# Patient Record
Sex: Female | Born: 1980 | Race: White | Hispanic: No | Smoking: Former smoker
Health system: Southern US, Community
[De-identification: ages and names within clinical notes are randomized; demographics above are authoritative.]

## PROBLEM LIST (undated history)

## (undated) DIAGNOSIS — L719 Rosacea, unspecified: Secondary | ICD-10-CM

## (undated) DIAGNOSIS — F32A Depression, unspecified: Secondary | ICD-10-CM

## (undated) DIAGNOSIS — E079 Disorder of thyroid, unspecified: Secondary | ICD-10-CM

## (undated) DIAGNOSIS — E039 Hypothyroidism, unspecified: Secondary | ICD-10-CM

## (undated) DIAGNOSIS — F419 Anxiety disorder, unspecified: Secondary | ICD-10-CM

## (undated) HISTORY — PX: KIDNEY STONE SURGERY: SHX686

---

## 2009-02-23 ENCOUNTER — Ambulatory Visit: Payer: Self-pay | Admitting: Diagnostic Radiology

## 2009-02-23 ENCOUNTER — Emergency Department (HOSPITAL_BASED_OUTPATIENT_CLINIC_OR_DEPARTMENT_OTHER): Admission: EM | Admit: 2009-02-23 | Discharge: 2009-02-24 | Payer: Self-pay | Admitting: Emergency Medicine

## 2012-05-18 ENCOUNTER — Emergency Department
Admission: EM | Admit: 2012-05-18 | Discharge: 2012-05-18 | Disposition: A | Discharge status: Home / Self Care | Payer: BC Managed Care – PPO | Source: Home / Self Care | Attending: Family Medicine | Admitting: Family Medicine

## 2012-05-18 DIAGNOSIS — J069 Acute upper respiratory infection, unspecified: Secondary | ICD-10-CM

## 2012-05-18 MED ORDER — AMOXICILLIN 875 MG PO TABS: 875.0000 mg | ORAL_TABLET | Freq: Two times a day (BID) | ORAL | Status: AC

## 2012-05-18 MED ORDER — BENZONATATE 200 MG PO CAPS: 200.0000 mg | ORAL_CAPSULE | Freq: Every day | ORAL | Status: AC

## 2012-05-18 NOTE — ED Notes (Signed)
Melinda Clark complains of nasal congestion for 3 days and a wet non productive cough for 1 day. She also has hoarse voice. Denies fever, chills or sweats.

## 2012-05-18 NOTE — ED Provider Notes (Signed)
History     CSN: [id number]  Arrival date & time 05/18/12  1741   First MD Initiated Contact with Patient 05/18/12 1829      Chief Complaint  Patient presents with  . Nasal Congestion    x 3 days      HPI Comments: Patient complains of 3 day history of gradually progressive URI symptoms beginning with nasal congestion and a cough.  Complains of fatigue but no myalgias.  Cough is now worse at night and generally productive during the day.  There has been no pleuritic pain, shortness of breath, or wheezes.  No fevers, chills, and sweats.  She notes that she had asthma as a child, and she continues to smoke.  The history is provided by the patient.    History reviewed. No pertinent past medical history.  Past Surgical History  Procedure Date  . Kidney stone surgery     Family History  Problem Relation Age of Onset  . Cancer Other     Breast    History  Substance Use Topics  . Smoking status: Current Everyday Smoker -- 7 years  . Smokeless tobacco: Never Used  . Alcohol Use: Yes     occationally    OB History    Grav Para Term Preterm Abortions TAB SAB Ect Mult Living                  Review of Systems No sore throat + cough No pleuritic pain No wheezing + nasal congestion + post-nasal drainage No sinus pain/pressure No itchy/red eyes No earache No hemoptysis No SOB No fever/chills No nausea No vomiting No abdominal pain No diarrhea No urinary symptoms No skin rashes + fatigue No myalgias No headache Used OTC meds without relief  Allergies  Azithromycin  Home Medications   Current Outpatient Rx  Name Route Sig Dispense Refill  . AMOXICILLIN 875 MG PO TABS Oral Take 1 tablet (875 mg total) by mouth 2 (two) times daily. 20 tablet 0  . BENZONATATE 200 MG PO CAPS Oral Take 1 capsule (200 mg total) by mouth at bedtime. Take as needed for cough 12 capsule 0    BP 107/73  Pulse 82  Temp 97.8 F (36.6 C) (Oral)  Resp 18  Ht 5\' 2"  (1.575 m)  Wt  165 lb (74.844 kg)  BMI 30.18 kg/m2  SpO2 98%  LMP 05/03/2012  Physical Exam Nursing notes and Vital Signs reviewed. Appearance:  Patient appears healthy, stated age, and in no acute distress Eyes:  Pupils are equal, round, and reactive to light and accomodation.  Extraocular movement is intact.  Conjunctivae are not inflamed  Ears:  Canals normal.  Tympanic membranes normal.  Nose:  Mildly congested turbinates.  No sinus tenderness.   Pharynx:  Normal Neck:  Supple.  Slightly tender shotty posterior nodes are palpated bilaterally  Lungs:  Clear to auscultation.  Breath sounds are equal.  Heart:  Regular rate and rhythm without murmurs, rubs, or gallops.  Abdomen:  Nontender without masses or hepatosplenomegaly.  Bowel sounds are present.  No CVA or flank tenderness.  Extremities:  No edema.  No calf tenderness Skin:  No rash present.   ED Course  Procedures none      1. Acute upper respiratory infections of unspecified site       MDM  With a history of smoking, and childhood asthma, will begin amoxicillin.  Prescription written for Benzonatate [facility]) to take at bedtime for night-time cough.  Take  plain Mucinex (guaifenesin) twice daily for cough and congestion.  Increase fluid intake, rest. May use Afrin nasal spray (or generic oxymetazoline) twice daily for about 5 days.  Also recommend using saline nasal spray several times daily and saline nasal irrigation (AYR is a common brand) Stop all antihistamines for now, and other non-prescription cough/cold preparations.. Follow-up with family doctor if not improving 7 to 10 days.         Lattie Haw, MD 05/22/12 1230

## 2012-05-24 ENCOUNTER — Telehealth: Payer: Self-pay

## 2012-05-24 NOTE — ED Notes (Signed)
Left a message on voice mail asking how patient is feeling and advising to call back with any questions or concerns.  

## 2022-02-05 ENCOUNTER — Other Ambulatory Visit: Payer: Self-pay

## 2022-02-05 ENCOUNTER — Encounter (HOSPITAL_BASED_OUTPATIENT_CLINIC_OR_DEPARTMENT_OTHER): Payer: Self-pay

## 2022-02-05 ENCOUNTER — Emergency Department (HOSPITAL_BASED_OUTPATIENT_CLINIC_OR_DEPARTMENT_OTHER): Payer: BC Managed Care – PPO

## 2022-02-05 ENCOUNTER — Emergency Department (HOSPITAL_BASED_OUTPATIENT_CLINIC_OR_DEPARTMENT_OTHER)
Admission: EM | Admit: 2022-02-05 | Discharge: 2022-02-05 | Disposition: A | Discharge status: Home / Self Care | Payer: BC Managed Care – PPO | Attending: Emergency Medicine | Admitting: Emergency Medicine

## 2022-02-05 DIAGNOSIS — M25572 Pain in left ankle and joints of left foot: Secondary | ICD-10-CM | POA: Insufficient documentation

## 2022-02-05 DIAGNOSIS — W108XXA Fall (on) (from) other stairs and steps, initial encounter: Secondary | ICD-10-CM | POA: Insufficient documentation

## 2022-02-05 DIAGNOSIS — S99912A Unspecified injury of left ankle, initial encounter: Secondary | ICD-10-CM | POA: Diagnosis present

## 2022-02-05 DIAGNOSIS — S82302A Unspecified fracture of lower end of left tibia, initial encounter for closed fracture: Secondary | ICD-10-CM | POA: Diagnosis not present

## 2022-02-05 DIAGNOSIS — S82832A Other fracture of upper and lower end of left fibula, initial encounter for closed fracture: Secondary | ICD-10-CM | POA: Insufficient documentation

## 2022-02-05 HISTORY — DX: Depression, unspecified: F32.A

## 2022-02-05 HISTORY — DX: Disorder of thyroid, unspecified: E07.9

## 2022-02-05 HISTORY — DX: Anxiety disorder, unspecified: F41.9

## 2022-02-05 MED ORDER — OXYCODONE-ACETAMINOPHEN 5-325 MG PO TABS
2.0000 | ORAL_TABLET | Freq: Once | ORAL | Status: AC
  Administered 2022-02-05: 2 via ORAL
  Filled 2022-02-05: qty 2

## 2022-02-05 MED ORDER — OXYCODONE-ACETAMINOPHEN 5-325 MG PO TABS: 2.0000 | ORAL_TABLET | Freq: Four times a day (QID) | ORAL | 0 refills | Status: DC | PRN

## 2022-02-05 NOTE — Discharge Instructions (Signed)
Please call the orthopedics to schedule an appointment.  As we discussed, you can take 800 mg of ibuprofen every 8 hours.  You can use Percocet for breakthrough pain.  Please return to the emergency department for any worsening symptoms you might have.  Please keep leg elevated. ?

## 2022-02-05 NOTE — ED Provider Notes (Signed)
?[facility] EMERGENCY DEPARTMENT ?Provider Note ? ? ?CSN: [id number] ?Arrival date & time: 02/05/22  1011 ? ?  ? ?History ?Chief Complaint  ?Patient presents with  ? Ankle Injury  ? ? ?Melinda Clark is a 41 y.o. female who presents to the emergency department with left ankle pain after mechanical slip and fall down 3 steps just prior to arrival.  Patient was walking going down steps when she slipped on a wet surface when she felt her ankle pop and go backwards.  She had immediate pain since the incident and has been unable to bear weight.  No other injury.  Did not hit her head or lose consciousness. ? ? ?Ankle Injury ? ? ?  ? ?Home Medications ?Prior to Admission medications   ?Medication Sig Start Date End Date Taking? Authorizing Provider  ?oxyCODONE-acetaminophen (PERCOCET/ROXICET) 5-325 MG tablet Take 2 tablets by mouth every 6 (six) hours as needed for severe pain. 02/05/22  Yes Teressa Lower, PA-C  ?   ? ?Allergies    ?Azithromycin, Escitalopram oxalate, and Metronidazole   ? ?Review of Systems   ?Review of Systems  ?All other systems reviewed and are negative. ? ?Physical Exam ?Updated Vital Signs ?BP 102/71   Pulse 81   Temp 97.8 ?F (36.6 ?C) (Oral)   Resp 18   Ht 5\' 1"  (1.549 m)   Wt 83.9 kg   SpO2 99%   BMI 34.96 kg/m?  ?Physical Exam ?Vitals and nursing note reviewed.  ?Constitutional:   ?   Appearance: Normal appearance.  ?HENT:  ?   Head: Normocephalic and atraumatic.  ?Eyes:  ?   General:     ?   Right eye: No discharge.     ?   Left eye: No discharge.  ?   Conjunctiva/sclera: Conjunctivae normal.  ?Pulmonary:  ?   Effort: Pulmonary effort is normal.  ?Musculoskeletal:  ?   Comments: There is mild swelling to the left ankle.  No obvious ecchymosis or deformity.  Patient has strong equal 2+ dorsalis pedis pulses bilaterally.  Good sensation in her toes.  Difficult to assess cap refill is toenails are painted.  Able to move her left toes. Squeeze test negative.  ?Skin: ?   General:  Skin is warm and dry.  ?   Findings: No rash.  ?Neurological:  ?   General: No focal deficit present.  ?   Mental Status: She is alert.  ?Psychiatric:     ?   Mood and Affect: Mood normal.     ?   Behavior: Behavior normal.  ? ? ?ED Results / Procedures / Treatments   ?Labs ?(all labs ordered are listed, but only abnormal results are displayed) ?Labs Reviewed - No data to display ? ?EKG ?None ? ?Radiology ?DG Ankle Complete Left ? ?Result Date: 02/05/2022 ?CLINICAL DATA:  Trauma, fall EXAM: LEFT ANKLE COMPLETE - 3+ VIEW COMPARISON:  None. FINDINGS: There is oblique fracture in the distal shaft and distal metaphysis of left fibula. There is a proximally 3 mm offset in the alignment. There is fracture in the base of medial malleolus with a proximally 3 mm offset in the alignment. There is fracture in the posterior aspect of distal tibia involving the articular surface. IMPRESSION: Oblique, slightly displaced fracture is seen in the distal shaft and distal metaphysis of left fibula. Slightly displaced fracture is seen in the medial malleolus in the left tibia. Essentially undisplaced fracture is seen in the posterior aspect of distal left tibia.  Electronically Signed   By: Ernie Avena M.D.   On: 02/05/2022 10:50  ? ?DG Foot Complete Left ? ?Result Date: 02/05/2022 ?CLINICAL DATA:  Trauma, fall EXAM: LEFT FOOT - COMPLETE 3+ VIEW COMPARISON:  None. FINDINGS: Fractures are seen in the distal left tibia and fibula which will better evaluated in the images of left ankle. Rest of the visualized bony structures show no recent fracture or dislocation. There are no opaque foreign bodies. IMPRESSION: Fractures are seen in the distal left tibia and fibula. Electronically Signed   By: Ernie Avena M.D.   On: 02/05/2022 10:48   ? ?Procedures ?Procedures  ? ? ?Medications Ordered in ED ?Medications  ?oxyCODONE-acetaminophen (PERCOCET/ROXICET) 5-325 MG per tablet 2 tablet (2 tablets Oral Given 02/05/22 1027)  ? ? ?ED  Course/ Medical Decision Making/ A&P ?  ?                        ?Medical Decision Making ?Amount and/or Complexity of Data Reviewed ?Radiology: ordered. ? ?Risk ?Prescription drug management. ? ? ?This patient presents to the ED for concern of left ankle pain, this involves an extensive number of treatment options, and is a complaint that carries with it a high risk of complications and morbidity.  The differential diagnosis includes fracture dislocation.  I doubt any syndesmosis injury as squeeze test was negative.  I doubt compartment syndrome.  I doubt arterial compromise. ? ? ?Co morbidities that complicate the patient evaluation ? ?Past Medical History:  ?Diagnosis Date  ? Anxiety   ? Depression   ? Thyroid disease   ? ? ?Additional history obtained: ? ?Additional history obtained from nursing note ? ? ?Lab Tests: ? ?I Ordered, and personally interpreted labs.  The pertinent results include: None ? ? ?Imaging Studies ordered: ? ?I ordered imaging studies including x-ray of the ankle and foot ?I independently visualized and interpreted imaging which showed fibular and tibial fracture ?I agree with the radiologist interpretation ? ? ?Cardiac Monitoring: ? ?The patient was maintained on a cardiac monitor.  I personally viewed and interpreted the cardiac monitored which showed an underlying rhythm of: Normal sinus rhythm ? ? ?Medicines ordered and prescription drug management: ? ?I ordered medication including Percocet for pain ?Reevaluation of the patient after these medicines showed that the patient improved ?I have reviewed the patients home medicines and have made adjustments as needed ? ? ?Critical Interventions: ? ?Pain control ? ? ?Problem List / ED Course: ? ?Tibia and fibular fracture. Mortise is in good alignment and fractures do not appear to be significantly angulated or displaced.  Do not feel that manipulation is necessary at this time.  Patient placed in stirrup and posterior ankle splint and  crutches were given.  I have sent her narcotic pain medication to her pharmacy.  Given her follow-up with orthopedics.  She was encouraged to return to the emergency department for any worsening symptoms. ? ? ?Reevaluation: ? ?After the interventions noted above, I reevaluated the patient and found that they have :improved ? ? ?Social Determinants of Health: ? ?Social Determinants of Health with Concerns  ? ?Tobacco Use: Medium Risk  ? Smoking Tobacco Use: Former  ? Smokeless Tobacco Use: Never  ? Passive Exposure: Not on file  ?Financial Resource Strain: Not on file  ?Food Insecurity: Not on file  ?Transportation Needs: Not on file  ?Physical Activity: Not on file  ?Stress: Not on file  ?Social Connections: Not on file  ?Intimate  Partner Violence: Not on file  ?Depression (PHQ2-9): Not on file  ?Alcohol Screen: Not on file  ?Housing: Not on file  ? ? ?Dispostion: ? ?After consideration of the diagnostic results and the patients response to treatment, I feel that the patent would benefit from outpatient follow-up with orthopedics.  Patient does not meet inpatient criteria at this time ? ?Final Clinical Impression(s) / ED Diagnoses ?Final diagnoses:  ?Closed fracture of distal end of left fibula, unspecified fracture morphology, initial encounter  ?Closed fracture of distal end of left tibia, unspecified fracture morphology, initial encounter  ? ? ?Rx / DC Orders ?ED Discharge Orders   ? ?      Ordered  ?  oxyCODONE-acetaminophen (PERCOCET/ROXICET) 5-325 MG tablet  Every 6 hours PRN       ? 02/05/22 1110  ? ?  ?  ? ?  ? ? ?  ?Honor LohFleming, Camesha Farooq [place]ouch[place], PA-C ?02/05/22 1157 ? ?  ?Maia PlanLong, Joshua G, MD ?02/12/22 0005 ? ?

## 2022-02-05 NOTE — ED Triage Notes (Signed)
[place] C[place]o [place] ankle injury. ?

## 2022-02-07 ENCOUNTER — Other Ambulatory Visit (HOSPITAL_COMMUNITY): Payer: Self-pay | Admitting: Orthopedic Surgery

## 2022-02-07 ENCOUNTER — Encounter (HOSPITAL_BASED_OUTPATIENT_CLINIC_OR_DEPARTMENT_OTHER): Payer: Self-pay | Admitting: Orthopedic Surgery

## 2022-02-07 ENCOUNTER — Other Ambulatory Visit: Payer: Self-pay

## 2022-02-08 NOTE — Progress Notes (Signed)

## 2022-02-10 ENCOUNTER — Ambulatory Visit (HOSPITAL_BASED_OUTPATIENT_CLINIC_OR_DEPARTMENT_OTHER): Payer: BC Managed Care – PPO | Admitting: Certified Registered"

## 2022-02-10 ENCOUNTER — Encounter (HOSPITAL_BASED_OUTPATIENT_CLINIC_OR_DEPARTMENT_OTHER): Payer: Self-pay | Admitting: Orthopedic Surgery

## 2022-02-10 ENCOUNTER — Ambulatory Visit (HOSPITAL_BASED_OUTPATIENT_CLINIC_OR_DEPARTMENT_OTHER)
Admission: RE | Admit: 2022-02-10 | Discharge: 2022-02-10 | Disposition: A | Discharge status: Home / Self Care | Payer: BC Managed Care – PPO | Attending: Orthopedic Surgery | Admitting: Orthopedic Surgery

## 2022-02-10 ENCOUNTER — Other Ambulatory Visit: Payer: Self-pay

## 2022-02-10 ENCOUNTER — Encounter (HOSPITAL_BASED_OUTPATIENT_CLINIC_OR_DEPARTMENT_OTHER)
Admission: RE | Disposition: A | Discharge status: Home / Self Care | Payer: Self-pay | Source: Home / Self Care | Attending: Orthopedic Surgery

## 2022-02-10 ENCOUNTER — Ambulatory Visit (HOSPITAL_BASED_OUTPATIENT_CLINIC_OR_DEPARTMENT_OTHER): Payer: BC Managed Care – PPO

## 2022-02-10 DIAGNOSIS — F419 Anxiety disorder, unspecified: Secondary | ICD-10-CM | POA: Diagnosis not present

## 2022-02-10 DIAGNOSIS — S82852A Displaced trimalleolar fracture of left lower leg, initial encounter for closed fracture: Secondary | ICD-10-CM | POA: Diagnosis present

## 2022-02-10 DIAGNOSIS — Z79899 Other long term (current) drug therapy: Secondary | ICD-10-CM | POA: Insufficient documentation

## 2022-02-10 DIAGNOSIS — Z87891 Personal history of nicotine dependence: Secondary | ICD-10-CM | POA: Diagnosis not present

## 2022-02-10 DIAGNOSIS — E079 Disorder of thyroid, unspecified: Secondary | ICD-10-CM | POA: Insufficient documentation

## 2022-02-10 DIAGNOSIS — W108XXA Fall (on) (from) other stairs and steps, initial encounter: Secondary | ICD-10-CM | POA: Insufficient documentation

## 2022-02-10 DIAGNOSIS — Z79890 Hormone replacement therapy (postmenopausal): Secondary | ICD-10-CM | POA: Insufficient documentation

## 2022-02-10 DIAGNOSIS — F32A Depression, unspecified: Secondary | ICD-10-CM | POA: Insufficient documentation

## 2022-02-10 HISTORY — PX: ORIF ANKLE FRACTURE: SHX5408

## 2022-02-10 LAB — POCT PREGNANCY, URINE: Preg Test, Ur: NEGATIVE

## 2022-02-10 SURGERY — OPEN REDUCTION INTERNAL FIXATION (ORIF) ANKLE FRACTURE
Anesthesia: General | Site: Ankle | Laterality: Left

## 2022-02-10 MED ORDER — OXYCODONE HCL 5 MG/5ML PO SOLN: 5.0000 mg | Freq: Once | ORAL | Status: DC | PRN

## 2022-02-10 MED ORDER — VANCOMYCIN HCL 500 MG IV SOLR
INTRAVENOUS | Status: DC | PRN
  Administered 2022-02-10: 500 mg via TOPICAL

## 2022-02-10 MED ORDER — SUGAMMADEX SODIUM 500 MG/5ML IV SOLN
INTRAVENOUS | Status: AC
  Filled 2022-02-10: qty 5

## 2022-02-10 MED ORDER — ONDANSETRON HCL 4 MG/2ML IJ SOLN
INTRAMUSCULAR | Status: AC
  Filled 2022-02-10: qty 16

## 2022-02-10 MED ORDER — ONDANSETRON HCL 4 MG/2ML IJ SOLN
INTRAMUSCULAR | Status: DC | PRN
  Administered 2022-02-10: 4 mg via INTRAVENOUS

## 2022-02-10 MED ORDER — PROPOFOL 500 MG/50ML IV EMUL
INTRAVENOUS | Status: AC
  Filled 2022-02-10: qty 150

## 2022-02-10 MED ORDER — BUPIVACAINE-EPINEPHRINE (PF) 0.5% -1:200000 IJ SOLN
INTRAMUSCULAR | Status: DC | PRN
  Administered 2022-02-10: 25 mL via PERINEURAL
  Administered 2022-02-10: 15 mL via PERINEURAL

## 2022-02-10 MED ORDER — DEXAMETHASONE SODIUM PHOSPHATE 10 MG/ML IJ SOLN
INTRAMUSCULAR | Status: DC | PRN
  Administered 2022-02-10: 4 mg via INTRAVENOUS

## 2022-02-10 MED ORDER — DOCUSATE SODIUM 100 MG PO CAPS: 100.0000 mg | ORAL_CAPSULE | Freq: Two times a day (BID) | ORAL | 0 refills | Status: DC

## 2022-02-10 MED ORDER — FENTANYL CITRATE (PF) 100 MCG/2ML IJ SOLN
INTRAMUSCULAR | Status: DC | PRN
  Administered 2022-02-10: 25 ug via INTRAVENOUS

## 2022-02-10 MED ORDER — FENTANYL CITRATE (PF) 100 MCG/2ML IJ SOLN
25.0000 ug | INTRAMUSCULAR | Status: DC | PRN
  Administered 2022-02-10: 50 ug via INTRAVENOUS

## 2022-02-10 MED ORDER — OXYCODONE HCL 5 MG PO TABS: 5.0000 mg | ORAL_TABLET | ORAL | 0 refills | Status: AC | PRN

## 2022-02-10 MED ORDER — MIDAZOLAM HCL 2 MG/2ML IJ SOLN
2.0000 mg | Freq: Once | INTRAMUSCULAR | Status: AC
  Administered 2022-02-10: 2 mg via INTRAVENOUS

## 2022-02-10 MED ORDER — CEFAZOLIN SODIUM-DEXTROSE 2-4 GM/100ML-% IV SOLN
INTRAVENOUS | Status: AC
  Filled 2022-02-10: qty 100

## 2022-02-10 MED ORDER — OXYCODONE HCL 5 MG PO TABS: 5.0000 mg | ORAL_TABLET | Freq: Once | ORAL | Status: DC | PRN

## 2022-02-10 MED ORDER — LACTATED RINGERS IV SOLN: INTRAVENOUS | Status: DC

## 2022-02-10 MED ORDER — ASPIRIN EC 81 MG PO TBEC
81.0000 mg | DELAYED_RELEASE_TABLET | Freq: Two times a day (BID) | ORAL | 0 refills | Status: DC
Start: 2022-02-10 — End: 2022-10-13

## 2022-02-10 MED ORDER — ROCURONIUM BROMIDE 10 MG/ML (PF) SYRINGE
PREFILLED_SYRINGE | INTRAVENOUS | Status: AC
  Filled 2022-02-10: qty 20

## 2022-02-10 MED ORDER — 0.9 % SODIUM CHLORIDE (POUR BTL) OPTIME
TOPICAL | Status: DC | PRN
  Administered 2022-02-10: 200 mL

## 2022-02-10 MED ORDER — DEXAMETHASONE SODIUM PHOSPHATE 10 MG/ML IJ SOLN
INTRAMUSCULAR | Status: AC
  Filled 2022-02-10: qty 2

## 2022-02-10 MED ORDER — FENTANYL CITRATE (PF) 100 MCG/2ML IJ SOLN
INTRAMUSCULAR | Status: AC
  Filled 2022-02-10: qty 2

## 2022-02-10 MED ORDER — FENTANYL CITRATE (PF) 100 MCG/2ML IJ SOLN
100.0000 ug | Freq: Once | INTRAMUSCULAR | Status: AC
  Administered 2022-02-10: 100 ug via INTRAVENOUS

## 2022-02-10 MED ORDER — SENNA 8.6 MG PO TABS
2.0000 | ORAL_TABLET | Freq: Two times a day (BID) | ORAL | 0 refills | Status: DC
Start: 2022-02-10 — End: 2022-10-13

## 2022-02-10 MED ORDER — VANCOMYCIN HCL 500 MG IV SOLR
INTRAVENOUS | Status: AC
  Filled 2022-02-10: qty 10

## 2022-02-10 MED ORDER — SODIUM CHLORIDE 0.9 % IV SOLN: INTRAVENOUS | Status: DC

## 2022-02-10 MED ORDER — MIDAZOLAM HCL 2 MG/2ML IJ SOLN
INTRAMUSCULAR | Status: AC
Start: 2022-02-10 — End: ?
  Filled 2022-02-10: qty 2

## 2022-02-10 MED ORDER — CEFAZOLIN SODIUM-DEXTROSE 2-4 GM/100ML-% IV SOLN: 2.0000 g | INTRAVENOUS | Status: DC

## 2022-02-10 MED ORDER — PROPOFOL 500 MG/50ML IV EMUL
INTRAVENOUS | Status: DC | PRN
  Administered 2022-02-10: 25 ug/kg/min via INTRAVENOUS

## 2022-02-10 MED ORDER — MIDAZOLAM HCL 5 MG/5ML IJ SOLN
INTRAMUSCULAR | Status: DC | PRN
Start: 2022-02-10 — End: 2022-02-10
  Administered 2022-02-10: 2 mg via INTRAVENOUS

## 2022-02-10 MED ORDER — LIDOCAINE 2% (20 MG/ML) 5 ML SYRINGE
INTRAMUSCULAR | Status: AC
  Filled 2022-02-10: qty 20

## 2022-02-10 MED ORDER — PROPOFOL 10 MG/ML IV BOLUS
INTRAVENOUS | Status: DC | PRN
  Administered 2022-02-10: 200 mg via INTRAVENOUS

## 2022-02-10 MED ORDER — MIDAZOLAM HCL 2 MG/2ML IJ SOLN
INTRAMUSCULAR | Status: AC
  Filled 2022-02-10: qty 2

## 2022-02-10 MED ORDER — LIDOCAINE 2% (20 MG/ML) 5 ML SYRINGE
INTRAMUSCULAR | Status: DC | PRN
Start: 2022-02-10 — End: 2022-02-10
  Administered 2022-02-10: 60 mg via INTRAVENOUS

## 2022-02-10 MED ORDER — ONDANSETRON HCL 4 MG/2ML IJ SOLN: 4.0000 mg | Freq: Four times a day (QID) | INTRAMUSCULAR | Status: DC | PRN

## 2022-02-10 SURGICAL SUPPLY — 75 items
APL PRP STRL LF DISP 70% ISPRP (MISCELLANEOUS) ×1
BANDAGE ESMARK 6X9 LF (GAUZE/BANDAGES/DRESSINGS) IMPLANT
BIT DRILL 2.5X2.75 QC CALB (BIT) ×1 IMPLANT
BIT DRILL 3.5X5.5 QC CALB (BIT) ×1 IMPLANT
BLADE SURG 15 STRL LF DISP TIS (BLADE) ×2 IMPLANT
BLADE SURG 15 STRL SS (BLADE) ×4
BNDG CMPR 9X4 STRL LF SNTH (GAUZE/BANDAGES/DRESSINGS)
BNDG CMPR 9X6 STRL LF SNTH (GAUZE/BANDAGES/DRESSINGS)
BNDG COHESIVE 4X5 TAN ST LF (GAUZE/BANDAGES/DRESSINGS) ×1 IMPLANT
BNDG COHESIVE 6X5 TAN ST LF (GAUZE/BANDAGES/DRESSINGS) ×1 IMPLANT
BNDG ELASTIC 4X5.8 VLCR STR LF (GAUZE/BANDAGES/DRESSINGS) ×1 IMPLANT
BNDG ELASTIC 6X5.8 VLCR STR LF (GAUZE/BANDAGES/DRESSINGS) ×1 IMPLANT
BNDG ESMARK 4X9 LF (GAUZE/BANDAGES/DRESSINGS) IMPLANT
BNDG ESMARK 6X9 LF (GAUZE/BANDAGES/DRESSINGS)
CANISTER SUCT 1200ML W/VALVE (MISCELLANEOUS) ×2 IMPLANT
CHLORAPREP W/TINT 26 (MISCELLANEOUS) ×2 IMPLANT
COVER BACK TABLE 60X90IN (DRAPES) ×2 IMPLANT
CUFF TOURN SGL QUICK 34 (TOURNIQUET CUFF)
CUFF TRNQT CYL 34X4.125X (TOURNIQUET CUFF) IMPLANT
DRAPE EXTREMITY T 121X128X90 (DISPOSABLE) ×2 IMPLANT
DRAPE OEC MINIVIEW 54X84 (DRAPES) ×2 IMPLANT
DRAPE U-SHAPE 47X51 STRL (DRAPES) ×2 IMPLANT
DRSG MEPITEL 4X7.2 (GAUZE/BANDAGES/DRESSINGS) ×2 IMPLANT
DRSG PAD ABDOMINAL 8X10 ST (GAUZE/BANDAGES/DRESSINGS) ×4 IMPLANT
ELECT REM PT RETURN 9FT ADLT (ELECTROSURGICAL) ×2
ELECTRODE REM PT RTRN 9FT ADLT (ELECTROSURGICAL) ×1 IMPLANT
GAUZE SPONGE 4X4 12PLY STRL (GAUZE/BANDAGES/DRESSINGS) ×2 IMPLANT
GLOVE SRG 8 PF TXTR STRL LF DI (GLOVE) ×2 IMPLANT
GLOVE SURG ENC MOIS LTX SZ8 (GLOVE) ×2 IMPLANT
GLOVE SURG LTX SZ8 (GLOVE) ×2 IMPLANT
GLOVE SURG POLYISO LF SZ7 (GLOVE) ×1 IMPLANT
GLOVE SURG UNDER POLY LF SZ7 (GLOVE) ×2 IMPLANT
GLOVE SURG UNDER POLY LF SZ8 (GLOVE) ×4
GOWN STRL REUS W/ TWL LRG LVL3 (GOWN DISPOSABLE) ×1 IMPLANT
GOWN STRL REUS W/ TWL XL LVL3 (GOWN DISPOSABLE) ×2 IMPLANT
GOWN STRL REUS W/TWL LRG LVL3 (GOWN DISPOSABLE) ×2
GOWN STRL REUS W/TWL XL LVL3 (GOWN DISPOSABLE) ×4
NEEDLE HYPO 22GX1.5 SAFETY (NEEDLE) IMPLANT
NS IRRIG 1000ML POUR BTL (IV SOLUTION) ×2 IMPLANT
PACK BASIN DAY SURGERY FS (CUSTOM PROCEDURE TRAY) ×2 IMPLANT
PAD CAST 4YDX4 CTTN HI CHSV (CAST SUPPLIES) ×1 IMPLANT
PADDING CAST ABS 4INX4YD NS (CAST SUPPLIES)
PADDING CAST ABS COTTON 4X4 ST (CAST SUPPLIES) IMPLANT
PADDING CAST COTTON 4X4 STRL (CAST SUPPLIES) ×2
PADDING CAST COTTON 6X4 STRL (CAST SUPPLIES) ×2 IMPLANT
PENCIL SMOKE EVACUATOR (MISCELLANEOUS) ×2 IMPLANT
PLATE ACE 100DEG 6HOLE (Plate) ×1 IMPLANT
SANITIZER HAND PURELL 535ML FO (MISCELLANEOUS) ×2 IMPLANT
SCREW ACE CAN 4.0 40M (Screw) ×2 IMPLANT
SCREW CORTICAL 3.5MM  16MM (Screw) ×3 IMPLANT
SCREW CORTICAL 3.5MM 14MM (Screw) ×2 IMPLANT
SCREW CORTICAL 3.5MM 16MM (Screw) IMPLANT
SCREW CORTICAL 3.5MM 26MM (Screw) ×1 IMPLANT
SHEET MEDIUM DRAPE 40X70 STRL (DRAPES) ×2 IMPLANT
SLEEVE SCD COMPRESS KNEE MED (STOCKING) ×2 IMPLANT
SPIKE FLUID TRANSFER (MISCELLANEOUS) IMPLANT
SPLINT FAST PLASTER 5X30 (CAST SUPPLIES) ×20
SPLINT PLASTER CAST FAST 5X30 (CAST SUPPLIES) ×20 IMPLANT
SPONGE T-LAP 18X18 ~~LOC~~+RFID (SPONGE) ×2 IMPLANT
STOCKINETTE 6  STRL (DRAPES) ×1
STOCKINETTE 6 STRL (DRAPES) ×1 IMPLANT
SUCTION FRAZIER HANDLE 10FR (MISCELLANEOUS) ×1
SUCTION TUBE FRAZIER 10FR DISP (MISCELLANEOUS) ×1 IMPLANT
SUT ETHILON 3 0 PS 1 (SUTURE) ×3 IMPLANT
SUT FIBERWIRE #2 38 T-5 BLUE (SUTURE)
SUT MNCRL AB 3-0 PS2 18 (SUTURE) IMPLANT
SUT VIC AB 2-0 SH 27 (SUTURE) ×4
SUT VIC AB 2-0 SH 27XBRD (SUTURE) ×1 IMPLANT
SUT VICRYL 0 SH 27 (SUTURE) IMPLANT
SUTURE FIBERWR #2 38 T-5 BLUE (SUTURE) IMPLANT
SYR BULB EAR ULCER 3OZ GRN STR (SYRINGE) ×2 IMPLANT
SYR CONTROL 10ML LL (SYRINGE) IMPLANT
TOWEL GREEN STERILE FF (TOWEL DISPOSABLE) ×4 IMPLANT
TUBE CONNECTING 20X1/4 (TUBING) ×2 IMPLANT
UNDERPAD 30X36 HEAVY ABSORB (UNDERPADS AND DIAPERS) ×2 IMPLANT

## 2022-02-10 NOTE — Transfer of Care (Signed)
Immediate Anesthesia Transfer of Care Note ? ?Patient: Melinda Clark ? ?Procedure(s) Performed: Open Reduction Internal Fixation (ORIF) Left ankle trimalleolar fracture (Left: Ankle) ? ?Patient Location: PACU ? ?Anesthesia Type:GA combined with regional for post-op pain ? ?Level of Consciousness: drowsy and patient cooperative ? ?Airway & Oxygen Therapy: Patient Spontanous Breathing and Patient connected to face mask oxygen ? ?Post-op Assessment: Report given to RN and Post -op Vital signs reviewed and stable ? ?Post vital signs: Reviewed and stable ? ?Last Vitals:  ?Vitals Value Taken Time  ?BP    ?Temp    ?Pulse 73 02/10/22 1022  ?Resp 25 02/10/22 1022  ?SpO2 98 % 02/10/22 1022  ?Vitals shown include unvalidated device data. ? ?Last Pain:  ?Vitals:  ? 02/10/22 0815  ?TempSrc: Oral  ?PainSc: 4   ?   ? ?Patients Stated Pain Goal: 5 (02/10/22 0815) ? ?Complications: No notable events documented. ?

## 2022-02-10 NOTE — Progress Notes (Signed)
Assisted Dr. Marcie Bal with left, adductor canal, popliteal, ultrasound guided block. Side rails up, monitors on throughout procedure. See vital signs in flow sheet. Tolerated Procedure well. ?

## 2022-02-10 NOTE — Anesthesia Procedure Notes (Signed)
Anesthesia Regional Block: Popliteal block  ? ?Pre-Anesthetic Checklist: , timeout performed,  Correct Patient, Correct Site, Correct Laterality,  Correct Procedure, Correct Position, site marked,  Risks and benefits discussed,  Surgical consent,  Pre-op evaluation,  At surgeon's request and post-op pain management ? ?Laterality: Left ? ?Prep: chloraprep     ?  ?Needles:  ?Injection technique: Single-shot ? ?Needle Type: Echogenic Stimulator Needle   ? ? ? ? ? ? ? ?Additional Needles: ? ? ?Procedures:, nerve stimulator,,,,,    ? ?Nerve Stimulator or Paresthesia:  ?Response: plantar flexion of foot, 0.45 mA ? ?Additional Responses:  ? ?Narrative:  ?Start time: 02/10/2022 8:55 AM ?End time: 02/10/2022 9:03 AM ?Injection made incrementally with aspirations every 5 mL. ? ?Performed by: Personally  ?Anesthesiologist: Achille Rich, MD ? ?Additional Notes: ?Functioning IV was confirmed and monitors were applied.  A 43mm 21ga Arrow echogenic stimulator needle was used. Sterile prep and drape,hand hygiene and sterile gloves were used.  Negative aspiration and negative test dose prior to incremental administration of local anesthetic. The patient tolerated the procedure well. ? Ultrasound guidance: relevent anatomy identified, needle position confirmed, local anesthetic spread visualized around nerve(s), vascular puncture avoided.  Image printed for medical record.  ? ? ? ? ?

## 2022-02-10 NOTE — Discharge Instructions (Addendum)
Toni Arthurs, MD ?[facility] ? ?Please read the following information regarding your care after surgery. ? ?Medications  ?You only need a prescription for the narcotic pain medicine (ex. oxycodone, Percocet, Norco).  All of the other medicines listed below are available over the counter. ?? Aleve 2 pills twice a day for the first 3 days after surgery. ?? acetominophen (Tylenol) 650 mg every 4-6 hours as you need for minor to moderate pain ?? oxycodone as prescribed for severe pain ? ?Narcotic pain medicine (ex. oxycodone, Percocet, Vicodin) will cause constipation.  To prevent this problem, take the following medicines while you are taking any pain medicine. ?? docusate sodium (Colace) 100 mg twice a day ? senna (Senokot) 2 tablets twice a day ? ?? To help prevent blood clots, take a baby aspirin (81 mg) twice a day for two weeks after surgery.  You should also get up every hour while you are awake to move around.   ? ?Weight Bearing ?? Do not bear any weight on the operated leg or foot. ? ?Cast / Splint / Dressing ?? Keep your splint, cast or dressing clean and dry.  Don?t put anything (coat hanger, pencil, etc) down inside of it.  If it gets damp, use a hair dryer on the cool setting to dry it.  If it gets soaked, call the office to schedule an appointment for a cast change. ? ? ?After your dressing, cast or splint is removed; you may shower, but do not soak or scrub the wound.  Allow the water to run over it, and then gently pat it dry. ? ?Swelling ?It is normal for you to have swelling where you had surgery.  To reduce swelling and pain, keep your toes above your nose for at least 3 days after surgery.  It may be necessary to keep your foot or leg elevated for several weeks.  If it hurts, it should be elevated. ? ?Follow Up ?Call my office at [phone] when you are discharged from the hospital or surgery center to schedule an appointment to be seen two weeks after surgery. ? ?Call my office at [phone] if  you develop a fever >101.5? F, nausea, vomiting, bleeding from the surgical site or severe pain.   ?  ? ?Regional Anesthesia Blocks ? ?1. Numbness or the inability to move the "blocked" extremity may last from 3-48 hours after placement. The length of time depends on the medication injected and your individual response to the medication. If the numbness is not going away after 48 hours, call your surgeon. ? ?2. The extremity that is blocked will need to be protected until the numbness is gone and the  Strength has returned. Because you cannot feel it, you will need to take extra care to avoid injury. Because it may be weak, you may have difficulty moving it or using it. You may not know what position it is in without looking at it while the block is in effect. ? ?3. For blocks in the legs and feet, returning to weight bearing and walking needs to be done carefully. You will need to wait until the numbness is entirely gone and the strength has returned. You should be able to move your leg and foot normally before you try and bear weight or walk. You will need someone to be with you when you first try to ensure you do not fall and possibly risk injury. ? ?4. Bruising and tenderness at the needle site are common side effects and  will resolve in a few days. ? ?5. Persistent numbness or new problems with movement should be communicated to the surgeon or the [facility] [phone] [facility] [phone]).  ?Post Anesthesia Home Care Instructions ? ?Activity: ?Get plenty of rest for the remainder of the day. A responsible individual must stay with you for 24 hours following the procedure.  ?For the next 24 hours, DO NOT: ?-Drive a car ?-Advertising copywriter ?-Drink alcoholic beverages ?-Take any medication unless instructed by your physician ?-Make any legal decisions or sign important papers. ? ?Meals: ?Start with liquid foods such as gelatin or soup. Progress to regular foods as tolerated.  Avoid greasy, spicy, heavy foods. If nausea and/or vomiting occur, drink only clear liquids until the nausea and/or vomiting subsides. Call your physician if vomiting continues. ? ?Special Instructions/Symptoms: ?Your throat may feel dry or sore from the anesthesia or the breathing tube placed in your throat during surgery. If this causes discomfort, gargle with warm salt water. The discomfort should disappear within 24 hours. ? ?If you had a scopolamine patch placed behind your ear for the management of post- operative nausea and/or vomiting: ? ?1. The medication in the patch is effective for 72 hours, after which it should be removed.  Wrap patch in a tissue and discard in the trash. Wash hands thoroughly with soap and water. ?2. You may remove the patch earlier than 72 hours if you experience unpleasant side effects which may include dry mouth, dizziness or visual disturbances. ?3. Avoid touching the patch. Wash your hands with soap and water after contact with the patch. ?    ?

## 2022-02-10 NOTE — Anesthesia Procedure Notes (Signed)
Procedure Name: LMA Insertion ?Date/Time: 02/10/2022 9:29 AM ?Performed by: Sheryn Bison, CRNA ?Pre-anesthesia Checklist: Patient identified, Emergency Drugs available, Suction available and Patient being monitored ?Patient Re-evaluated:Patient Re-evaluated prior to induction ?Oxygen Delivery Method: Circle System Utilized ?Preoxygenation: Pre-oxygenation with 100% oxygen ?Induction Type: IV induction ?Ventilation: Mask ventilation without difficulty ?LMA: LMA inserted ?LMA Size: 4.0 ?Number of attempts: 1 ?Airway Equipment and Method: bite block ?Placement Confirmation: positive ETCO2 ?Tube secured with: Tape ?Dental Injury: Teeth and Oropharynx as per pre-operative assessment  ? ? ? ? ?

## 2022-02-10 NOTE — Anesthesia Preprocedure Evaluation (Signed)
Anesthesia Evaluation  ?Patient identified by MRN, date of birth, ID band ?Patient awake ? ? ? ?Reviewed: ?Allergy & Precautions, H&P , NPO status , Patient's Chart, lab work & pertinent test results ? ?Airway ?Mallampati: II ? ? ?Neck ROM: full ? ? ? Dental ?  ?Pulmonary ?former smoker,  ?  ?breath sounds clear to auscultation ? ? ? ? ? ? Cardiovascular ?negative cardio ROS ? ? ?Rhythm:regular Rate:Normal ? ? ?  ?Neuro/Psych ?PSYCHIATRIC DISORDERS Anxiety Depression   ? GI/Hepatic ?  ?Endo/Other  ?obese ? Renal/GU ?  ? ?  ?Musculoskeletal ?Left ankle fx  ? Abdominal ?  ?Peds ? Hematology ?  ?Anesthesia Other Findings ? ? Reproductive/Obstetrics ? ?  ? ? ? ? ? ? ? ? ? ? ? ? ? ?  ?  ? ? ? ? ? ? ? ? ?Anesthesia Physical ?Anesthesia Plan ? ?ASA: 2 ? ?Anesthesia Plan: General  ? ?Post-op Pain Management: Regional block*  ? ?Induction: Intravenous ? ?PONV Risk Score and Plan: 3 and Ondansetron, Dexamethasone, Midazolam and Treatment may vary due to age or medical condition ? ?Airway Management Planned: LMA ? ?Additional Equipment:  ? ?Intra-op Plan:  ? ?Post-operative Plan: Extubation in OR ? ?Informed Consent: I have reviewed the patients History and Physical, chart, labs and discussed the procedure including the risks, benefits and alternatives for the proposed anesthesia with the patient or authorized representative who has indicated his/her understanding and acceptance.  ? ? ? ?Dental advisory given ? ?Plan Discussed with: CRNA, Anesthesiologist and Surgeon ? ?Anesthesia Plan Comments:   ? ? ? ? ? ? ?Anesthesia Quick Evaluation ? ?

## 2022-02-10 NOTE — Anesthesia Procedure Notes (Signed)
Anesthesia Regional Block: Adductor canal block  ? ?Pre-Anesthetic Checklist: , timeout performed,  Correct Patient, Correct Site, Correct Laterality,  Correct Procedure, Correct Position, site marked,  Risks and benefits discussed,  Surgical consent,  Pre-op evaluation,  At surgeon's request and post-op pain management ? ?Laterality: Left ? ?Prep: chloraprep     ?  ?Needles:  ?Injection technique: Single-shot ? ?Needle Type: Echogenic Needle   ? ? ?Needle Length: 9cm  ?Needle Gauge: 21  ? ? ? ?Additional Needles: ? ? ?Narrative:  ?Start time: 02/10/2022 9:03 AM ?End time: 02/10/2022 9:09 AM ?Injection made incrementally with aspirations every 5 mL. ? ?Performed by: Personally  ?Anesthesiologist: Achille Rich, MD ? ?Additional Notes: ?Pt tolerated the procedure well. ? ? ? ? ?

## 2022-02-10 NOTE — Anesthesia Postprocedure Evaluation (Signed)
Anesthesia Post Note ? ?Patient: Melinda Clark ? ?Procedure(s) Performed: Open Reduction Internal Fixation (ORIF) Left ankle trimalleolar fracture (Left: Ankle) ? ?  ? ?Patient location during evaluation: PACU ?Anesthesia Type: General and Regional ?Level of consciousness: awake and alert ?Pain management: pain level controlled ?Vital Signs Assessment: post-procedure vital signs reviewed and stable ?Respiratory status: spontaneous breathing, nonlabored ventilation, respiratory function stable and patient connected to nasal cannula oxygen ?Cardiovascular status: blood pressure returned to baseline and stable ?Postop Assessment: no apparent nausea or vomiting ?Anesthetic complications: no ? ? ?No notable events documented. ? ?Last Vitals:  ?Vitals:  ? 02/10/22 1115 02/10/22 1202  ?BP: 112/68 114/84  ?Pulse: 83 84  ?Resp: 17 16  ?Temp:  (!) 36.3 ?C  ?SpO2: 96% 97%  ?  ?Last Pain:  ?Vitals:  ? 02/10/22 1156  ?TempSrc:   ?PainSc: 0-No pain  ? ? ?  ?  ?  ?  ?  ?  ? ?Moranda Billiot S ? ? ? ? ?

## 2022-02-10 NOTE — H&P (Signed)
Melinda Clark is an 41 y.o. female.   ?Chief Complaint: left ankle pain ?HPI: 41 year old female without significant past medical history slipped and injured her left ankle on some wet steps over a week ago.  She has a trimalleolar fracture and presents now for operative treatment of this displaced and unstable left ankle injury. ? ?Past Medical History:  ?Diagnosis Date  ? Anxiety   ? Depression   ? Thyroid disease   ? ? ?Past Surgical History:  ?Procedure Laterality Date  ? KIDNEY STONE SURGERY    ? ? ?Family History  ?Problem Relation Age of Onset  ? Cancer Other   ?     Breast  ? ?Social History:  reports that she has quit smoking. Her smoking use included cigarettes. She has never used smokeless tobacco. She reports current alcohol use. She reports that she does not use drugs. ? ?Allergies:  ?Allergies  ?Allergen Reactions  ? Azithromycin Diarrhea  ? Escitalopram Oxalate Other (See Comments)  ?  Shakes, weakness ?  ? Metronidazole Other (See Comments)  ?  thrush ?Thrush in mouth ?  ? ? ?Medications Prior to Admission  ?Medication Sig Dispense Refill  ? ALPRAZolam (XANAX) 0.25 MG tablet Take 0.25 mg by mouth every 6 (six) hours as needed for anxiety.    ? Ergocalciferol (VITAMIN D2 PO) Take by mouth.    ? ibuprofen (ADVIL) 800 MG tablet Take 800 mg by mouth every 8 (eight) hours as needed.    ? levocetirizine (XYZAL) 5 MG tablet Take 5 mg by mouth every evening.    ? levothyroxine (SYNTHROID) 50 MCG tablet Take 50 mcg by mouth daily before breakfast.    ? oxyCODONE-acetaminophen (PERCOCET/ROXICET) 5-325 MG tablet Take 2 tablets by mouth every 6 (six) hours as needed for severe pain. 25 tablet 0  ? ? ?Results for orders placed or performed during the hospital encounter of 02/12/22 (from the past 48 hour(s))  ?Pregnancy, urine POC     Status: None  ? Collection Time: 12-Feb-2022  7:54 AM  ?Result Value Ref Range  ? Preg Test, Ur NEGATIVE NEGATIVE  ?  Comment:        ?THE SENSITIVITY OF THIS ?METHODOLOGY IS >24  mIU/mL ?  ? ?DG MINI C-ARM IMAGE ONLY ? ?Result Date: 02-12-22 ?There is no interpretation for this exam.  This order is for images obtained during a surgical procedure.  Please See "Surgeries" Tab for more information regarding the procedure.   ? ?Review of Systems no recent fever, chills, nausea, vomiting or changes in her appetite ? ?Blood pressure (!) 123/99, pulse 78, temperature 98.1 ?F (36.7 ?C), temperature source Oral, resp. rate 16, height 5\' 1"  (1.549 m), weight 87.3 kg, SpO2 100 %. ?Physical Exam  ?Well-nourished well-developed woman in no apparent distress.  Alert and oriented x4.  Normal mood and affect.  Gait is nonweightbearing on the left.  The left ankle has moderate swelling.  Skin is healthy and intact.  Pulses are palpable in the foot.  No lymphadenopathy. ?Assessment/Plan ?Left ankle trimalleolar fracture -to the operating room today for open treatment with internal fixation.  The risks and benefits of the alternative treatment options have been discussed in detail.  The patient wishes to proceed with surgery and specifically understands risks of bleeding, infection, nerve damage, blood clots, need for additional surgery, amputation and death.  ? ?Wylene Simmer, MD ?12-Feb-2022, 8:52 AM ? ? ? ?

## 2022-02-10 NOTE — Op Note (Signed)
02/10/2022 ? ?10:25 AM ? ?PATIENT:  Melinda Clark  41 y.o. female ? ?PRE-OPERATIVE DIAGNOSIS:  left ankle trimalleolar fracture ? ?POST-OPERATIVE DIAGNOSIS:  left ankle trimalleolar fracture ? ?Procedure(s):  1.  Open treatment left ankle trimalleolar fracture with internal fixation without fixation of the posterior malleolus ?  2.  Stress exam of the left ankle under fluoro ?  3.  AP, mortise and lateral xrays of the left ankle ? ?SURGEON:  Toni Arthurs, MD ? ?ASSISTANT: Alfredo Martinez, PA-C ? ?ANESTHESIA:   General, regional ? ?EBL:  minimal  ? ?TOURNIQUET:   ?Total Tourniquet Time Documented: ?Thigh (Left) - 34 minutes ?Total: Thigh (Left) - 34 minutes ? ?COMPLICATIONS:  None apparent ? ?DISPOSITION:  Extubated, awake and stable to recovery. ? ?INDICATION FOR PROCEDURE: 41 year old female without significant past medical history now has a left trimalleolar ankle fracture after a fall down wet stairs over a week ago.  She presents now for operative treatment of this displaced and unstable left ankle injury.  The risks and benefits of the alternative treatment options have been discussed in detail.  The patient wishes to proceed with surgery and specifically understands risks of bleeding, infection, nerve damage, blood clots, need for additional surgery, amputation and death.  ? ?PROCEDURE IN DETAIL:  The risks and benefits of the alternative treatment options have been discussed in detail.  The patient wishes to proceed with surgery and specifically understands risks of bleeding, infection, nerve damage, blood clots, need for additional surgery, amputation and death.  The left lower extremity was prepped and draped in standard sterile fashion with a tourniquet around the thigh.  The extremity was elevated and the tourniquet was inflated to 250 mmHg.  A longitudinal incision was made over the lateral malleolus.  Dissection was carried down through the subcutaneous tissues.  The fracture site was identified.  It was  opened and cleaned of all hematoma and irrigated copiously.  1 small fragment of cartilage was removed.  The fracture was reduced and provisionally held with a tenaculum.  Radiographs confirmed appropriate reduction of the fibula.  A 3.5 mm fully threaded lag screw was inserted from anterior to posterior across the fracture site.  A 6 hole one third tubular plate from the [employer] titanium small frag set was selected.  It was contoured to fit the lateral malleolus.  It was secured distally with 2 unicortical screws and proximally with 3 bicortical screws.  Radiographs confirmed appropriate reduction of the fibula and appropriate position and length of all hardware. ? ?Attention was turned to the medial side of the ankle.  A longitudinal incision was made and dissection carried down through the subcutaneous tissues.  The fracture site was identified.  It was opened and irrigated copiously.  All hematoma was cleared out along with periosteum.  The fracture was reduced and held with a pointed tenaculum.  Radiographs confirmed appropriate reduction of the medial malleolus.  2 K wires were inserted from the tip of the medial malleolus across the fracture site into the metaphyseal bone of the distal tibia.  Radiographs confirmed appropriate position of both guidepins.  The pins were overdrilled and 4 mm x 40 mm partially-threaded cannulated screws were inserted.  Both were noted to have excellent purchase and compressed the fracture site appropriately. ? ?AP, lateral and mortise radiographs of the right ankle confirmed appropriate reduction of the medial and lateral malleolus fractures.  The posterior malleolus fracture was also appropriately reduced and was quite small.  It did not involve  a significant portion of the articular surface. ? ?A stress examination was then performed.  A mortise view was obtained.  Dorsiflexion and external rotation stress was applied to the ankle with the supinated forefoot.  No  instability of the ankle mortise was identified. ? ?Medial and lateral ankle wounds were then irrigated copiously and sprinkled with vancomycin powder.  Subcutaneous tissues were approximated with Vicryl.  Skin incisions were closed with nylon.  Sterile dressings were applied followed by a well-padded short leg splint.  The tourniquet was released after application of the dressings.  The patient was awakened from anesthesia and transported to the recovery room in stable condition. ? ? ?FOLLOW UP PLAN: Nonweightbearing on the left lower extremity.  Follow-up in the office in 2 weeks for suture removal and conversion to a cam boot to begin early range of motion and weightbearing.  Aspirin for DVT prophylaxis. ? ? ?RADIOGRAPHS: AP, lateral and mortise radiographs of the left ankle are obtained intraoperatively.  These show interval reduction of the trimalleolar fracture.  Hardware is appropriately positioned and of the appropriate lengths.  No other acute injuries are noted. ? ? ? Alfredo Martinez PA-C was present and scrubbed for the duration of the operative case. His assistance was essential in positioning the patient, prepping and draping, gaining and maintaining exposure, performing the operation, closing and dressing the wounds and applying the splint. ? ?  ?

## 2022-02-11 ENCOUNTER — Encounter (HOSPITAL_BASED_OUTPATIENT_CLINIC_OR_DEPARTMENT_OTHER): Payer: Self-pay | Admitting: Orthopedic Surgery

## 2022-08-16 ENCOUNTER — Other Ambulatory Visit: Payer: Self-pay | Admitting: Student

## 2022-08-16 DIAGNOSIS — S82852D Displaced trimalleolar fracture of left lower leg, subsequent encounter for closed fracture with routine healing: Secondary | ICD-10-CM

## 2022-08-17 ENCOUNTER — Ambulatory Visit
Admission: RE | Admit: 2022-08-17 | Discharge: 2022-08-17 | Disposition: A | Discharge status: Home / Self Care | Payer: BC Managed Care – PPO | Source: Ambulatory Visit | Attending: Student | Admitting: Student

## 2022-08-17 DIAGNOSIS — S82852D Displaced trimalleolar fracture of left lower leg, subsequent encounter for closed fracture with routine healing: Secondary | ICD-10-CM

## 2022-09-25 IMAGING — DX DG FOOT COMPLETE 3+V*L*
3 series · 3 of 3 positions shown · non-contrast
Comparison: None.

CLINICAL DATA: Trauma, fall

EXAM:
LEFT FOOT - COMPLETE 3+ VIEW

[foot ap]
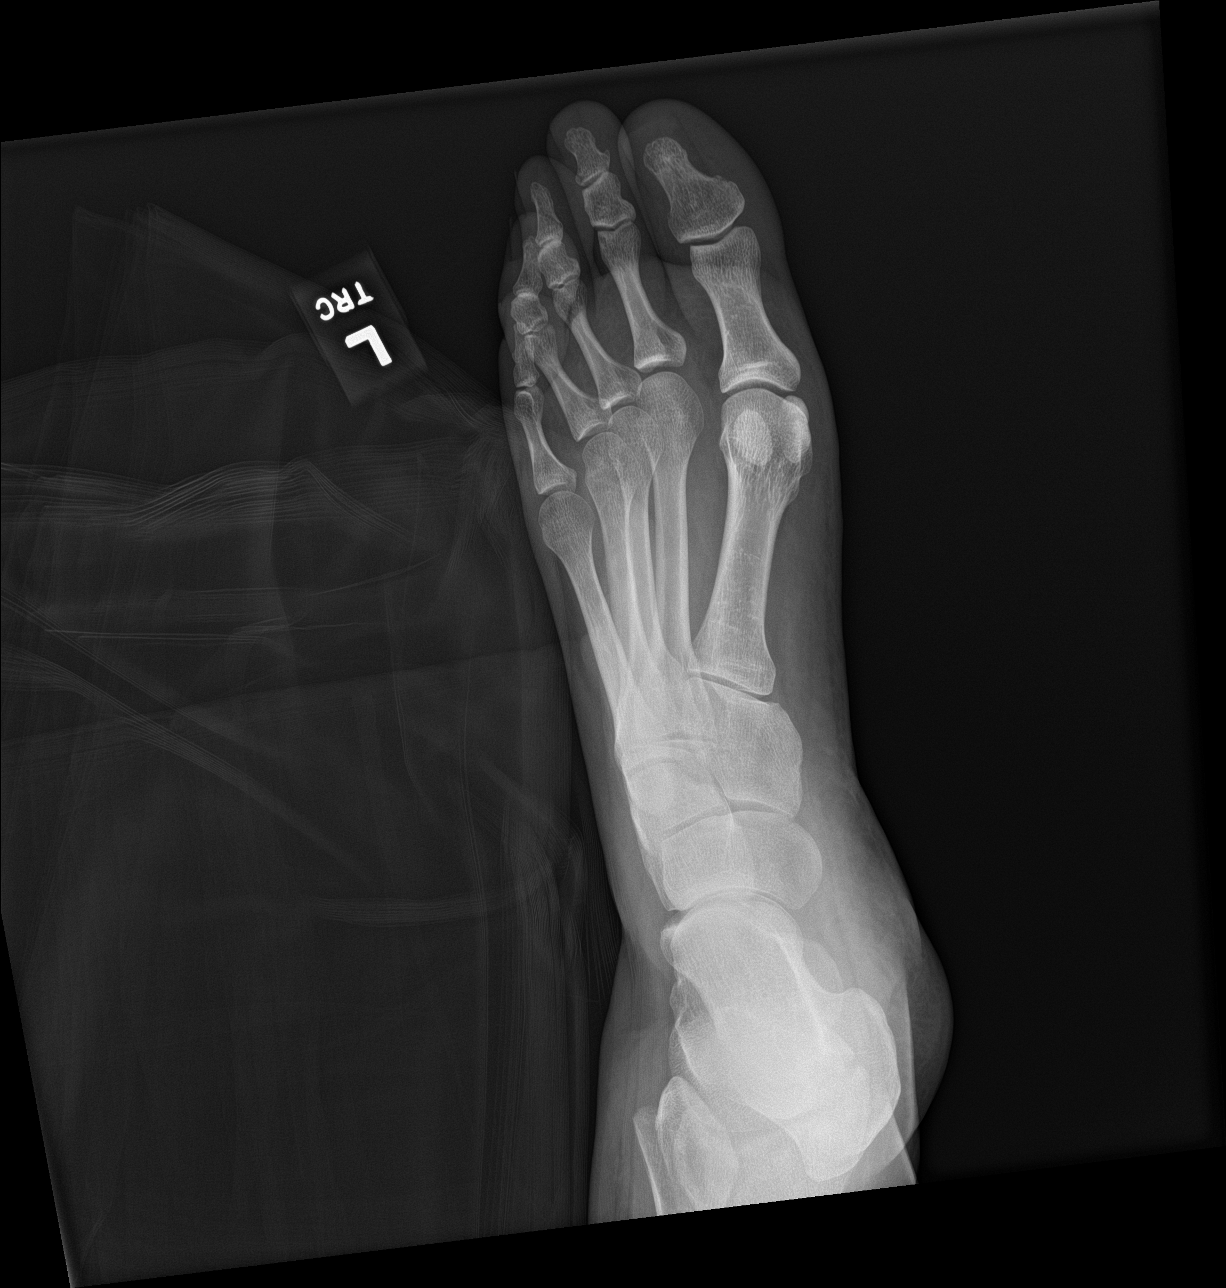

[foot obl]
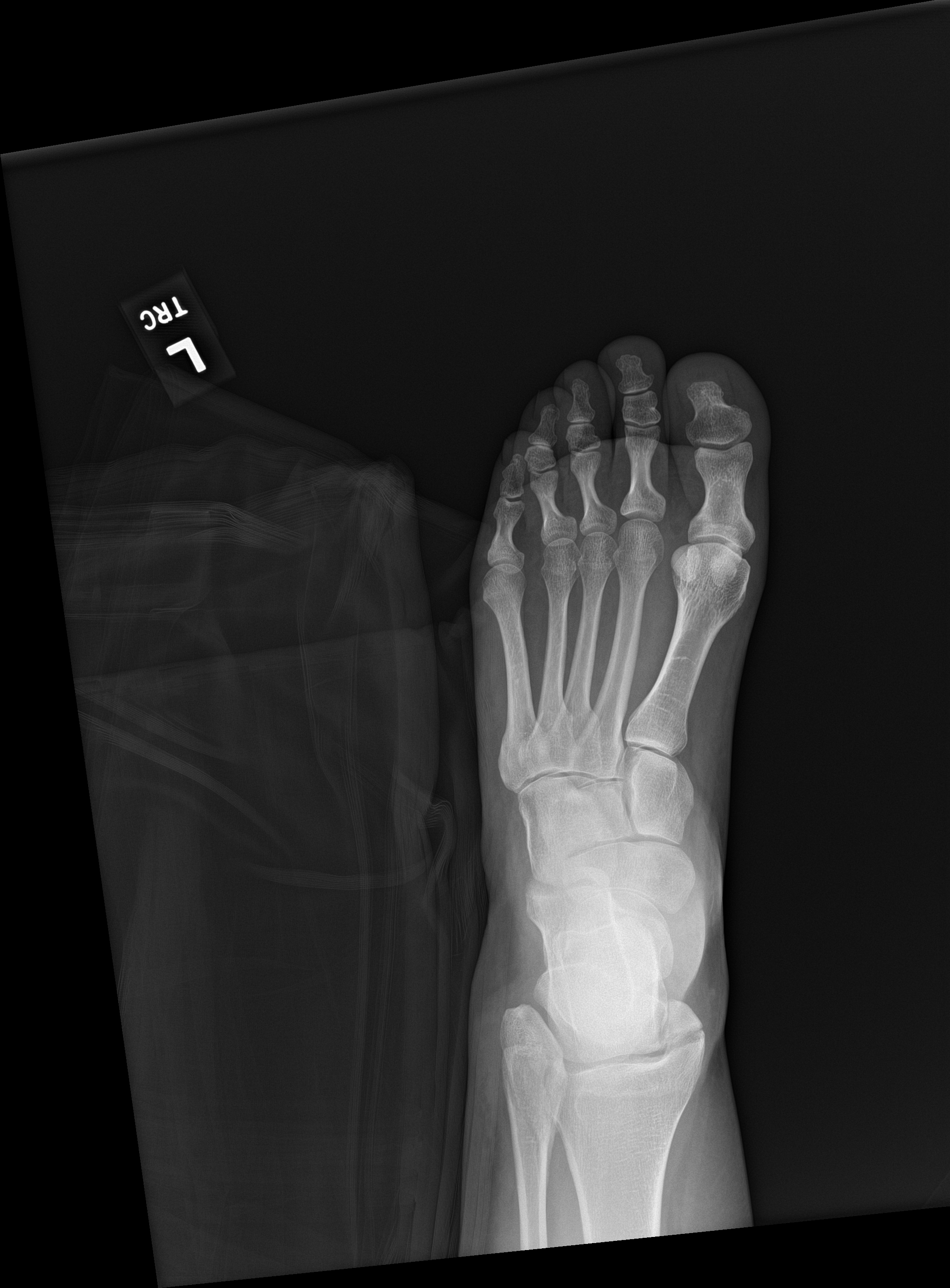

[foot lat]
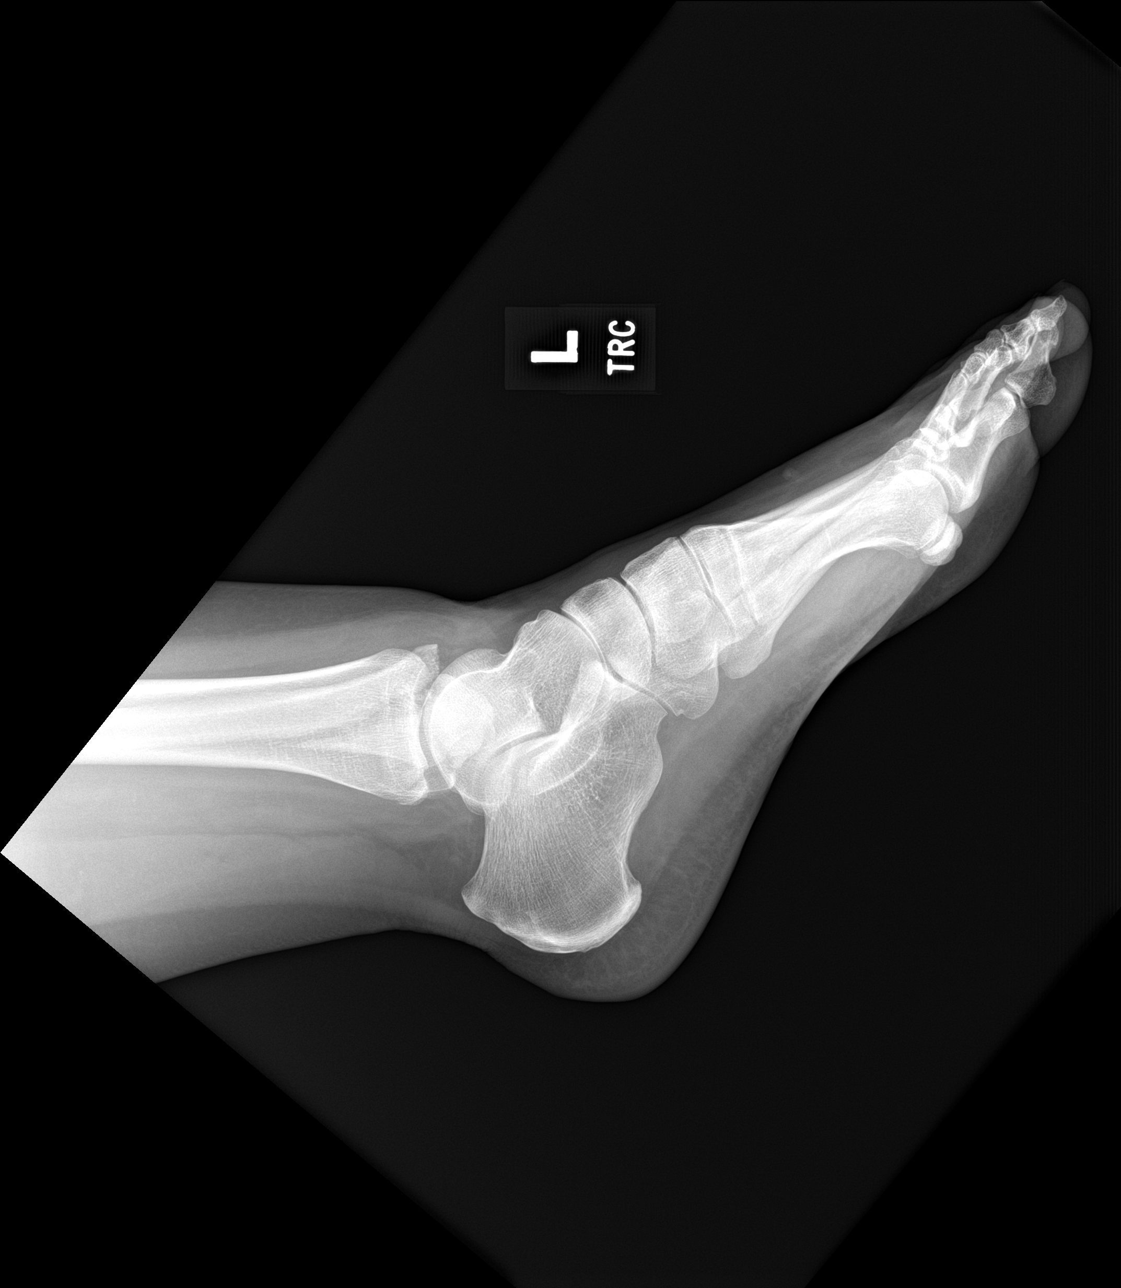

[3 of 3 positions shown; findings below may reference images not displayed]

FINDINGS: Fractures are seen in the distal left tibia and fibula which will
better evaluated in the images of left ankle. Rest of the visualized
bony structures show no recent fracture or dislocation. There are no
opaque foreign bodies.
IMPRESSION: Fractures are seen in the distal left tibia and fibula.

## 2022-10-13 ENCOUNTER — Encounter (HOSPITAL_BASED_OUTPATIENT_CLINIC_OR_DEPARTMENT_OTHER): Payer: Self-pay | Admitting: Orthopedic Surgery

## 2022-10-13 ENCOUNTER — Other Ambulatory Visit: Payer: Self-pay

## 2022-10-19 ENCOUNTER — Encounter (HOSPITAL_BASED_OUTPATIENT_CLINIC_OR_DEPARTMENT_OTHER): Payer: Self-pay | Admitting: Orthopedic Surgery

## 2022-10-19 ENCOUNTER — Other Ambulatory Visit (HOSPITAL_COMMUNITY): Payer: Self-pay | Admitting: Orthopedic Surgery

## 2022-10-19 NOTE — Anesthesia Preprocedure Evaluation (Addendum)
Anesthesia Evaluation  Patient identified by MRN, date of birth, ID band Patient awake    Reviewed: Allergy & Precautions, NPO status , Patient's Chart, lab work & pertinent test results  Airway Mallampati: I  TM Distance: >3 FB Neck ROM: Full    Dental  (+) Teeth Intact, Dental Advisory Given   Pulmonary former smoker   breath sounds clear to auscultation       Cardiovascular negative cardio ROS  Rhythm:Regular Rate:Normal     Neuro/Psych  PSYCHIATRIC DISORDERS Anxiety Depression       GI/Hepatic negative GI ROS, Neg liver ROS,,,  Endo/Other  Hypothyroidism    Renal/GU negative Renal ROS     Musculoskeletal negative musculoskeletal ROS (+)    Abdominal   Peds  Hematology negative hematology ROS (+)   Anesthesia Other Findings All: azithromycin, metronidazole  Reproductive/Obstetrics                             Anesthesia Physical Anesthesia Plan  ASA: 2  Anesthesia Plan: General   Post-op Pain Management:    Induction: Intravenous  PONV Risk Score and Plan: 4 or greater and Ondansetron, Dexamethasone, Midazolam and Scopolamine patch - Pre-op  Airway Management Planned: LMA  Additional Equipment: None  Intra-op Plan:   Post-operative Plan: Extubation in OR  Informed Consent: I have reviewed the patients History and Physical, chart, labs and discussed the procedure including the risks, benefits and alternatives for the proposed anesthesia with the patient or authorized representative who has indicated his/her understanding and acceptance.       Plan Discussed with: CRNA  Anesthesia Plan Comments:        Anesthesia Quick Evaluation

## 2022-10-20 ENCOUNTER — Ambulatory Visit (HOSPITAL_BASED_OUTPATIENT_CLINIC_OR_DEPARTMENT_OTHER): Payer: BC Managed Care – PPO | Admitting: Anesthesiology

## 2022-10-20 ENCOUNTER — Other Ambulatory Visit: Payer: Self-pay

## 2022-10-20 ENCOUNTER — Ambulatory Visit (HOSPITAL_BASED_OUTPATIENT_CLINIC_OR_DEPARTMENT_OTHER): Payer: BC Managed Care – PPO

## 2022-10-20 ENCOUNTER — Encounter (HOSPITAL_BASED_OUTPATIENT_CLINIC_OR_DEPARTMENT_OTHER): Payer: Self-pay | Admitting: Orthopedic Surgery

## 2022-10-20 ENCOUNTER — Ambulatory Visit (HOSPITAL_BASED_OUTPATIENT_CLINIC_OR_DEPARTMENT_OTHER)
Admission: RE | Admit: 2022-10-20 | Discharge: 2022-10-20 | Disposition: A | Discharge status: Home / Self Care | Payer: BC Managed Care – PPO | Source: Ambulatory Visit | Attending: Orthopedic Surgery | Admitting: Orthopedic Surgery

## 2022-10-20 ENCOUNTER — Encounter (HOSPITAL_BASED_OUTPATIENT_CLINIC_OR_DEPARTMENT_OTHER)
Admission: RE | Disposition: A | Discharge status: Home / Self Care | Payer: Self-pay | Source: Ambulatory Visit | Attending: Orthopedic Surgery

## 2022-10-20 DIAGNOSIS — X58XXXA Exposure to other specified factors, initial encounter: Secondary | ICD-10-CM | POA: Insufficient documentation

## 2022-10-20 DIAGNOSIS — Z87891 Personal history of nicotine dependence: Secondary | ICD-10-CM | POA: Insufficient documentation

## 2022-10-20 DIAGNOSIS — Z01818 Encounter for other preprocedural examination: Secondary | ICD-10-CM

## 2022-10-20 DIAGNOSIS — M659 Synovitis and tenosynovitis, unspecified: Secondary | ICD-10-CM | POA: Insufficient documentation

## 2022-10-20 DIAGNOSIS — F32A Depression, unspecified: Secondary | ICD-10-CM | POA: Insufficient documentation

## 2022-10-20 DIAGNOSIS — F419 Anxiety disorder, unspecified: Secondary | ICD-10-CM | POA: Diagnosis not present

## 2022-10-20 DIAGNOSIS — Y831 Surgical operation with implant of artificial internal device as the cause of abnormal reaction of the patient, or of later complication, without mention of misadventure at the time of the procedure: Secondary | ICD-10-CM | POA: Insufficient documentation

## 2022-10-20 DIAGNOSIS — E039 Hypothyroidism, unspecified: Secondary | ICD-10-CM | POA: Diagnosis not present

## 2022-10-20 DIAGNOSIS — T8484XA Pain due to internal orthopedic prosthetic devices, implants and grafts, initial encounter: Secondary | ICD-10-CM | POA: Insufficient documentation

## 2022-10-20 DIAGNOSIS — Z969 Presence of functional implant, unspecified: Secondary | ICD-10-CM

## 2022-10-20 HISTORY — DX: Hypothyroidism, unspecified: E03.9

## 2022-10-20 HISTORY — DX: Rosacea, unspecified: L71.9

## 2022-10-20 HISTORY — PX: POSTERIOR TIBIAL TENDON REPAIR: SHX6039

## 2022-10-20 HISTORY — PX: HARDWARE REMOVAL: SHX979

## 2022-10-20 LAB — POCT PREGNANCY, URINE: Preg Test, Ur: NEGATIVE

## 2022-10-20 SURGERY — REMOVAL, HARDWARE
Anesthesia: General | Site: Ankle | Laterality: Left

## 2022-10-20 MED ORDER — HYDROMORPHONE HCL 1 MG/ML IJ SOLN
INTRAMUSCULAR | Status: AC
  Filled 2022-10-20: qty 0.5

## 2022-10-20 MED ORDER — CEFAZOLIN SODIUM-DEXTROSE 2-4 GM/100ML-% IV SOLN
2.0000 g | INTRAVENOUS | Status: AC
  Administered 2022-10-20: 2 g via INTRAVENOUS

## 2022-10-20 MED ORDER — MIDAZOLAM HCL 2 MG/2ML IJ SOLN
INTRAMUSCULAR | Status: AC
  Filled 2022-10-20: qty 2

## 2022-10-20 MED ORDER — VANCOMYCIN HCL 500 MG IV SOLR
INTRAVENOUS | Status: AC
  Filled 2022-10-20: qty 10

## 2022-10-20 MED ORDER — OXYCODONE HCL 5 MG/5ML PO SOLN: 5.0000 mg | Freq: Once | ORAL | Status: AC | PRN

## 2022-10-20 MED ORDER — DEXAMETHASONE SODIUM PHOSPHATE 10 MG/ML IJ SOLN
INTRAMUSCULAR | Status: AC
  Filled 2022-10-20: qty 1

## 2022-10-20 MED ORDER — 0.9 % SODIUM CHLORIDE (POUR BTL) OPTIME
TOPICAL | Status: DC | PRN
  Administered 2022-10-20: 200 mL

## 2022-10-20 MED ORDER — ONDANSETRON HCL 4 MG/2ML IJ SOLN
INTRAMUSCULAR | Status: DC | PRN
  Administered 2022-10-20: 4 mg via INTRAVENOUS

## 2022-10-20 MED ORDER — KETOROLAC TROMETHAMINE 30 MG/ML IJ SOLN
INTRAMUSCULAR | Status: AC
  Filled 2022-10-20: qty 1

## 2022-10-20 MED ORDER — SENNA 8.6 MG PO TABS: 2.0000 | ORAL_TABLET | Freq: Two times a day (BID) | ORAL | 0 refills | Status: AC

## 2022-10-20 MED ORDER — VANCOMYCIN HCL 500 MG IV SOLR
INTRAVENOUS | Status: DC | PRN
  Administered 2022-10-20: 500 mg via TOPICAL

## 2022-10-20 MED ORDER — HYDROMORPHONE HCL 1 MG/ML IJ SOLN
0.2500 mg | INTRAMUSCULAR | Status: DC | PRN
  Administered 2022-10-20 (×3): 0.5 mg via INTRAVENOUS

## 2022-10-20 MED ORDER — DOCUSATE SODIUM 100 MG PO CAPS: 100.0000 mg | ORAL_CAPSULE | Freq: Two times a day (BID) | ORAL | 0 refills | Status: AC

## 2022-10-20 MED ORDER — LACTATED RINGERS IV SOLN: INTRAVENOUS | Status: DC

## 2022-10-20 MED ORDER — BUPIVACAINE-EPINEPHRINE 0.5% -1:200000 IJ SOLN
INTRAMUSCULAR | Status: DC | PRN
  Administered 2022-10-20: 20 mL

## 2022-10-20 MED ORDER — SODIUM CHLORIDE 0.9 % IV SOLN: INTRAVENOUS | Status: DC

## 2022-10-20 MED ORDER — OXYCODONE HCL 5 MG PO TABS: 5.0000 mg | ORAL_TABLET | ORAL | 0 refills | Status: AC | PRN

## 2022-10-20 MED ORDER — MIDAZOLAM HCL 5 MG/5ML IJ SOLN
INTRAMUSCULAR | Status: DC | PRN
  Administered 2022-10-20: 2 mg via INTRAVENOUS

## 2022-10-20 MED ORDER — DEXAMETHASONE SODIUM PHOSPHATE 10 MG/ML IJ SOLN
INTRAMUSCULAR | Status: DC | PRN
  Administered 2022-10-20: 10 mg via INTRAVENOUS

## 2022-10-20 MED ORDER — OXYCODONE HCL 5 MG PO TABS
ORAL_TABLET | ORAL | Status: AC
  Filled 2022-10-20: qty 1

## 2022-10-20 MED ORDER — SCOPOLAMINE 1 MG/3DAYS TD PT72
MEDICATED_PATCH | TRANSDERMAL | Status: AC
  Filled 2022-10-20: qty 1

## 2022-10-20 MED ORDER — PROPOFOL 10 MG/ML IV BOLUS
INTRAVENOUS | Status: DC | PRN
  Administered 2022-10-20: 200 mg via INTRAVENOUS

## 2022-10-20 MED ORDER — LIDOCAINE 2% (20 MG/ML) 5 ML SYRINGE
INTRAMUSCULAR | Status: DC | PRN
  Administered 2022-10-20: 40 mg via INTRAVENOUS

## 2022-10-20 MED ORDER — FENTANYL CITRATE (PF) 100 MCG/2ML IJ SOLN
INTRAMUSCULAR | Status: AC
  Filled 2022-10-20: qty 2

## 2022-10-20 MED ORDER — CEFAZOLIN SODIUM-DEXTROSE 2-4 GM/100ML-% IV SOLN
INTRAVENOUS | Status: AC
  Filled 2022-10-20: qty 100

## 2022-10-20 MED ORDER — KETOROLAC TROMETHAMINE 30 MG/ML IJ SOLN
30.0000 mg | Freq: Once | INTRAMUSCULAR | Status: AC | PRN
  Administered 2022-10-20: 30 mg via INTRAVENOUS

## 2022-10-20 MED ORDER — SCOPOLAMINE 1 MG/3DAYS TD PT72
1.0000 | MEDICATED_PATCH | TRANSDERMAL | Status: DC
  Administered 2022-10-20: 1.5 mg via TRANSDERMAL

## 2022-10-20 MED ORDER — DEXMEDETOMIDINE HCL IN NACL 80 MCG/20ML IV SOLN
INTRAVENOUS | Status: DC | PRN
  Administered 2022-10-20: 4 ug via BUCCAL
  Administered 2022-10-20: 8 ug via BUCCAL

## 2022-10-20 MED ORDER — FENTANYL CITRATE (PF) 100 MCG/2ML IJ SOLN
INTRAMUSCULAR | Status: DC | PRN
  Administered 2022-10-20 (×2): 50 ug via INTRAVENOUS

## 2022-10-20 MED ORDER — ONDANSETRON HCL 4 MG/2ML IJ SOLN
INTRAMUSCULAR | Status: AC
  Filled 2022-10-20: qty 2

## 2022-10-20 MED ORDER — OXYCODONE HCL 5 MG PO TABS
5.0000 mg | ORAL_TABLET | Freq: Once | ORAL | Status: AC | PRN
  Administered 2022-10-20: 5 mg via ORAL

## 2022-10-20 MED ORDER — ONDANSETRON HCL 4 MG/2ML IJ SOLN: 4.0000 mg | Freq: Once | INTRAMUSCULAR | Status: DC | PRN

## 2022-10-20 SURGICAL SUPPLY — 78 items
APL PRP STRL LF DISP 70% ISPRP (MISCELLANEOUS) ×1
APL SKNCLS STERI-STRIP NONHPOA (GAUZE/BANDAGES/DRESSINGS)
BANDAGE ESMARK 6X9 LF (GAUZE/BANDAGES/DRESSINGS) ×1 IMPLANT
BENZOIN TINCTURE PRP APPL 2/3 (GAUZE/BANDAGES/DRESSINGS) IMPLANT
BLADE AVERAGE 25X9 (BLADE) IMPLANT
BLADE SURG 15 STRL LF DISP TIS (BLADE) ×2 IMPLANT
BLADE SURG 15 STRL SS (BLADE) ×2
BNDG CMPR 9X4 STRL LF SNTH (GAUZE/BANDAGES/DRESSINGS)
BNDG CMPR 9X6 STRL LF SNTH (GAUZE/BANDAGES/DRESSINGS)
BNDG ELASTIC 4X5.8 VLCR STR LF (GAUZE/BANDAGES/DRESSINGS) ×1 IMPLANT
BNDG ELASTIC 6X5.8 VLCR STR LF (GAUZE/BANDAGES/DRESSINGS) ×1 IMPLANT
BNDG ESMARK 4X9 LF (GAUZE/BANDAGES/DRESSINGS) IMPLANT
BNDG ESMARK 6X9 LF (GAUZE/BANDAGES/DRESSINGS)
CHLORAPREP W/TINT 26 (MISCELLANEOUS) ×1 IMPLANT
COVER BACK TABLE 60X90IN (DRAPES) ×1 IMPLANT
CUFF TOURN SGL QUICK 34 (TOURNIQUET CUFF)
CUFF TRNQT CYL 34X4.125X (TOURNIQUET CUFF) ×1 IMPLANT
DRAPE EXTREMITY T 121X128X90 (DISPOSABLE) ×1 IMPLANT
DRAPE OEC MINIVIEW 54X84 (DRAPES) IMPLANT
DRAPE SURG 17X23 STRL (DRAPES) IMPLANT
DRAPE U-SHAPE 47X51 STRL (DRAPES) ×1 IMPLANT
DRSG MEPITEL 4X7.2 (GAUZE/BANDAGES/DRESSINGS) ×1 IMPLANT
ELECT REM PT RETURN 9FT ADLT (ELECTROSURGICAL) ×1
ELECTRODE REM PT RTRN 9FT ADLT (ELECTROSURGICAL) ×1 IMPLANT
GAUZE PAD ABD 8X10 STRL (GAUZE/BANDAGES/DRESSINGS) ×2 IMPLANT
GAUZE SPONGE 4X4 12PLY STRL (GAUZE/BANDAGES/DRESSINGS) ×1 IMPLANT
GLOVE BIO SURGEON STRL SZ8 (GLOVE) ×1 IMPLANT
GLOVE BIOGEL PI IND STRL 7.0 (GLOVE) IMPLANT
GLOVE BIOGEL PI IND STRL 8 (GLOVE) ×2 IMPLANT
GLOVE ECLIPSE 8.0 STRL XLNG CF (GLOVE) ×1 IMPLANT
GLOVE SURG SS PI 7.0 STRL IVOR (GLOVE) IMPLANT
GOWN STRL REUS W/ TWL LRG LVL3 (GOWN DISPOSABLE) ×1 IMPLANT
GOWN STRL REUS W/ TWL XL LVL3 (GOWN DISPOSABLE) ×2 IMPLANT
GOWN STRL REUS W/TWL LRG LVL3 (GOWN DISPOSABLE) ×1
GOWN STRL REUS W/TWL XL LVL3 (GOWN DISPOSABLE) ×2
K-WIRE DBL .062X4 NSTRL (WIRE)
KWIRE DBL .062X4 NSTRL (WIRE) IMPLANT
NDL SUT 6 .5 CRC .975X.05 MAYO (NEEDLE) IMPLANT
NEEDLE HYPO 22GX1.5 SAFETY (NEEDLE) IMPLANT
NEEDLE MAYO TAPER (NEEDLE)
NS IRRIG 1000ML POUR BTL (IV SOLUTION) ×1 IMPLANT
PACK BASIN DAY SURGERY FS (CUSTOM PROCEDURE TRAY) ×1 IMPLANT
PAD CAST 4YDX4 CTTN HI CHSV (CAST SUPPLIES) ×1 IMPLANT
PADDING CAST ABS COTTON 4X4 ST (CAST SUPPLIES) IMPLANT
PADDING CAST COTTON 4X4 STRL (CAST SUPPLIES) ×1
PADDING CAST COTTON 6X4 STRL (CAST SUPPLIES) ×1 IMPLANT
PASSER SUT SWANSON 36MM LOOP (INSTRUMENTS) IMPLANT
PENCIL SMOKE EVACUATOR (MISCELLANEOUS) ×1 IMPLANT
SANITIZER HAND PURELL FF 515ML (MISCELLANEOUS) ×1 IMPLANT
SCOTCHCAST PLUS 4X4 WHITE (CAST SUPPLIES) IMPLANT
SHEET MEDIUM DRAPE 40X70 STRL (DRAPES) ×1 IMPLANT
SLEEVE SCD COMPRESS KNEE MED (STOCKING) ×1 IMPLANT
SPIKE FLUID TRANSFER (MISCELLANEOUS) IMPLANT
SPLINT PLASTER CAST FAST 5X30 (CAST SUPPLIES) ×20 IMPLANT
SPONGE T-LAP 18X18 ~~LOC~~+RFID (SPONGE) ×1 IMPLANT
STOCKINETTE 6  STRL (DRAPES) ×1
STOCKINETTE 6 STRL (DRAPES) ×1 IMPLANT
STRIP CLOSURE SKIN 1/2X4 (GAUZE/BANDAGES/DRESSINGS) IMPLANT
SUCTION FRAZIER HANDLE 10FR (MISCELLANEOUS) ×1
SUCTION TUBE FRAZIER 10FR DISP (MISCELLANEOUS) IMPLANT
SUT ETHIBOND 0 MO6 C/R (SUTURE) IMPLANT
SUT ETHIBOND 2 OS 4 DA (SUTURE) IMPLANT
SUT ETHILON 3 0 PS 1 (SUTURE) ×1 IMPLANT
SUT FIBERWIRE 2-0 18 17.9 3/8 (SUTURE)
SUT MERSILENE 2.0 SH NDLE (SUTURE) IMPLANT
SUT MNCRL AB 3-0 PS2 18 (SUTURE) ×1 IMPLANT
SUT MNCRL AB 4-0 PS2 18 (SUTURE) IMPLANT
SUT VIC AB 2-0 SH 18 (SUTURE) IMPLANT
SUT VIC AB 2-0 SH 27 (SUTURE)
SUT VIC AB 2-0 SH 27XBRD (SUTURE) IMPLANT
SUT VICRYL 0 SH 27 (SUTURE) IMPLANT
SUT VICRYL 0 UR6 27IN ABS (SUTURE) IMPLANT
SUTURE FIBERWR 2-0 18 17.9 3/8 (SUTURE) IMPLANT
SYR BULB EAR ULCER 3OZ GRN STR (SYRINGE) ×1 IMPLANT
SYR CONTROL 10ML LL (SYRINGE) IMPLANT
TOWEL GREEN STERILE FF (TOWEL DISPOSABLE) ×1 IMPLANT
TUBE CONNECTING 20X1/4 (TUBING) IMPLANT
UNDERPAD 30X36 HEAVY ABSORB (UNDERPADS AND DIAPERS) ×1 IMPLANT

## 2022-10-20 NOTE — H&P (Signed)
Melinda Clark is an 41 y.o. female.   Chief Complaint: Left ankle pain HPI: 41 year old female healed after a bimalleolar ankle ORIF complains of pain at the medial ankle.  She has hardware adjacent to the posterior tibial tendon as well as signs and symptoms of posterior tibial tendinitis.  She also has some tenderness at the lateral malleolus where she has some hardware remaining.  She presents today for removal of hardware from the medial and lateral malleolus and posterior tibial tendon tenolysis.  Past Medical History:  Diagnosis Date   Anxiety    Depression    Hypothyroidism    Rosacea, unspecified    Thyroid disease     Past Surgical History:  Procedure Laterality Date   KIDNEY STONE SURGERY     ORIF ANKLE FRACTURE Left 02/10/2022   Procedure: Open Reduction Internal Fixation (ORIF) Left ankle trimalleolar fracture;  Surgeon: Toni Arthurs, MD;  Location: [facility];  Service: Orthopedics;  Laterality: Left;    Family History  Problem Relation Age of Onset   Cancer Other        Breast   Social History:  reports that she has quit smoking. Her smoking use included cigarettes. She has never used smokeless tobacco. She reports current alcohol use. She reports that she does not use drugs.  Allergies:  Allergies  Allergen Reactions   Azithromycin Diarrhea   Escitalopram Oxalate Other (See Comments)    Shakes, weakness    Metronidazole Other (See Comments)    thrush Thrush in mouth     Medications Prior to Admission  Medication Sig Dispense Refill   ALPRAZolam (XANAX) 0.25 MG tablet Take 0.25 mg by mouth every 6 (six) hours as needed for anxiety.     doxycycline (PERIOSTAT) 20 MG tablet Take 20 mg by mouth 2 (two) times daily.     Ergocalciferol (VITAMIN D2 PO) Take by mouth.     levocetirizine (XYZAL) 5 MG tablet Take 5 mg by mouth every evening.     levothyroxine (SYNTHROID) 50 MCG tablet Take 50 mcg by mouth daily before breakfast.     naproxen sodium  (ALEVE) 220 MG tablet Take 220 mg by mouth.      Results for orders placed or performed during the hospital encounter of 10/20/22 (from the past 48 hour(s))  Pregnancy, urine POC     Status: None   Collection Time: 10/20/22  6:24 AM  Result Value Ref Range   Preg Test, Ur NEGATIVE NEGATIVE    Comment:        THE SENSITIVITY OF THIS METHODOLOGY IS >24 mIU/mL    No results found.  Review of Systems no recent fever, chills, nausea, vomiting or changes in her appetite  Blood pressure 120/78, pulse 72, temperature 97.9 F (36.6 C), temperature source Oral, resp. rate 18, height 5\' 1"  (1.549 m), weight 89.3 kg, SpO2 99 %. Physical Exam  Well-nourished well-developed woman in no apparent distress.  Alert and oriented.  Normal mood and affect.  Surgical incisions around the left ankle are healed.  No signs of infection.  Tender to palpation along the posterior tibial tendon.  Also tender at the lateral malleolus.  5 out of 5 strength in plantarflexion and dorsiflexion of the ankle and toes.   Assessment/Plan Left ankle painful hardware at the medial and lateral malleoli; left posterior tibial tendinitis -to the operating room today for hardware removal medially and laterally and posterior tibial tendon tenolysis.  The risks and benefits of the alternative treatment options  have been discussed in detail.  The patient wishes to proceed with surgery and specifically understands risks of bleeding, infection, nerve damage, blood clots, need for additional surgery, amputation and death.   Toni Arthurs, MD 10/22/2022, 7:24 AM

## 2022-10-20 NOTE — Anesthesia Procedure Notes (Signed)
Procedure Name: LMA Insertion Date/Time: 10/20/2022 7:40 AM  Performed by: Burna Cash, CRNAPre-anesthesia Checklist: Patient identified, Emergency Drugs available, Suction available and Patient being monitored Patient Re-evaluated:Patient Re-evaluated prior to induction Oxygen Delivery Method: Circle system utilized Preoxygenation: Pre-oxygenation with 100% oxygen Induction Type: IV induction Ventilation: Mask ventilation without difficulty LMA: LMA inserted LMA Size: 4.0 Number of attempts: 1 Airway Equipment and Method: Bite block Placement Confirmation: positive ETCO2 Tube secured with: Tape Dental Injury: Teeth and Oropharynx as per pre-operative assessment

## 2022-10-20 NOTE — Anesthesia Postprocedure Evaluation (Signed)
Anesthesia Post Note  Patient: Melinda Clark  Procedure(s) Performed: HARDWARE REMOVAL ANKLE (Left: Ankle) POSTERIOR TIBIALTENOLYSIS (Left: Ankle)     Patient location during evaluation: PACU Anesthesia Type: General Level of consciousness: awake and alert Pain management: pain level controlled Vital Signs Assessment: post-procedure vital signs reviewed and stable Respiratory status: spontaneous breathing, nonlabored ventilation, respiratory function stable and patient connected to nasal cannula oxygen Cardiovascular status: blood pressure returned to baseline and stable Postop Assessment: no apparent nausea or vomiting Anesthetic complications: no   No notable events documented.  Last Vitals:  Vitals:   10/20/22 0900 10/20/22 0915  BP: 101/64 126/75  Pulse: 92 94  Resp: 18 18  Temp:  36.6 C  SpO2: 96% 97%    Last Pain:  Vitals:   10/20/22 0921  TempSrc:   PainSc: 4                  Shelton Silvas

## 2022-10-20 NOTE — Discharge Instructions (Addendum)
Toni Arthurs, MD [facility]  Please read the following information regarding your care after surgery.  Medications  You only need a prescription for the narcotic pain medicine (ex. oxycodone, Percocet, Norco).  All of the other medicines listed below are available over the counter. ? Aleve 2 pills twice a day for the first 3 days after surgery. ? acetominophen (Tylenol) 650 mg every 4-6 hours as you need for minor to moderate pain ? oxycodone as prescribed for severe pain  Narcotic pain medicine (ex. oxycodone, Percocet, Vicodin) will cause constipation.  To prevent this problem, take the following medicines while you are taking any pain medicine. ? docusate sodium (Colace) 100 mg twice a day ? senna (Senokot) 2 tablets twice a day  Weight Bearing ? Bear weight when you are able on your operated leg or foot.  Cast / Splint / Dressing ? Keep your splint, cast or dressing clean and dry.  Don't put anything (coat hanger, pencil, etc) down inside of it.  If it gets damp, use a hair dryer on the cool setting to dry it.  If it gets soaked, call the office to schedule an appointment for a cast change.   After your dressing, cast or splint is removed; you may shower, but do not soak or scrub the wound.  Allow the water to run over it, and then gently pat it dry.  Swelling It is normal for you to have swelling where you had surgery.  To reduce swelling and pain, keep your toes above your nose for at least 3 days after surgery.  It may be necessary to keep your foot or leg elevated for several weeks.  If it hurts, it should be elevated.  Follow Up Call my office at [phone] when you are discharged from the hospital or surgery center to schedule an appointment to be seen two weeks after surgery.  Call my office at [phone] if you develop a fever >101.5 F, nausea, vomiting, bleeding from the surgical site or severe pain.    No ibuprofen until after 2:30pm today if needed  Post  Anesthesia Home Care Instructions  Activity: Get plenty of rest for the remainder of the day. A responsible individual must stay with you for 24 hours following the procedure.  For the next 24 hours, DO NOT: -Drive a car -Advertising copywriter -Drink alcoholic beverages -Take any medication unless instructed by your physician -Make any legal decisions or sign important papers.  Meals: Start with liquid foods such as gelatin or soup. Progress to regular foods as tolerated. Avoid greasy, spicy, heavy foods. If nausea and/or vomiting occur, drink only clear liquids until the nausea and/or vomiting subsides. Call your physician if vomiting continues.  Special Instructions/Symptoms: Your throat may feel dry or sore from the anesthesia or the breathing tube placed in your throat during surgery. If this causes discomfort, gargle with warm salt water. The discomfort should disappear within 24 hours.  If you had a scopolamine patch placed behind your ear for the management of post- operative nausea and/or vomiting:  1. The medication in the patch is effective for 72 hours, after which it should be removed.  Wrap patch in a tissue and discard in the trash. Wash hands thoroughly with soap and water. 2. You may remove the patch earlier than 72 hours if you experience unpleasant side effects which may include dry mouth, dizziness or visual disturbances. 3. Avoid touching the patch. Wash your hands with soap and water after contact with the patch.

## 2022-10-20 NOTE — Op Note (Signed)
10/20/2022  8:33 AM  PATIENT:  Melinda Clark  41 y.o. female  PRE-OPERATIVE DIAGNOSIS: 1.  Painful hardware at the left fibula and left tibia medial malleolus 2.  Left posterior tibial tendon tenosynovitis  POST-OPERATIVE DIAGNOSIS: Same  Procedure(s): 1.  Removal of deep implants from the left fibula lateral malleolus 2.  Removal of deep implants from the left tibia medial malleolus (separate incision) 3.  Left posterior tibial tendon tenolysis  SURGEON:  Toni Arthurs, MD  ASSISTANT: Alfredo Martinez, PA-C  ANESTHESIA:   General, local  EBL:  minimal   TOURNIQUET:   Total Tourniquet Time Documented: Thigh (Left) - 30 minutes Total: Thigh (Left) - 30 minutes  COMPLICATIONS:  None apparent  DISPOSITION:  Extubated, awake and stable to recovery.  INDICATION FOR PROCEDURE: 41 year old female is healed after ORIF of a bimalleolar ankle fracture.  She has painful hardware at the medial malleolus as well as the lateral malleolus and symptoms of posterior tibial tendinitis.  She has failed nonoperative treatment and presents today for removal of these painful implants and tenolysis of the posterior tibial tendon.  The risks and benefits of the alternative treatment options have been discussed in detail.  The patient wishes to proceed with surgery and specifically understands risks of bleeding, infection, nerve damage, blood clots, need for additional surgery, amputation and death.   PROCEDURE IN DETAIL:  After pre operative consent was obtained, and the correct operative site was identified, the patient was brought to the operating room and placed supine on the OR table.  Anesthesia was administered.  Pre-operative antibiotics were administered.  A surgical timeout was taken.  The left lower extremity was prepped and draped in standard sterile fashion with a tourniquet around the thigh.  The extremity was elevated, and the tourniquet was inflated to 250 mmHg.  The previous lateral incision was  identified.  Was opened again sharply and dissection carried down through the subcutaneous tissues to the superficial aspect of the plate.  All fibrous tissue was cleared from the plate.  The screws were removed from the fibula without difficulty.  The plate was also removed.  The lag screw was identified anteriorly.  It was dissected free and removed in its entirety.  The wound was irrigated copiously and sprinkled with vancomycin powder.  Subcutaneous tissues were approximated with Monocryl.  The skin incision was closed with nylon.  Attention was turned to the medial ankle.  The previous medial incision was opened sharply and dissection carried down through the subcutaneous tissues.  The more posterior of the 2 screws was identified.  A K wire was inserted into the head of the screw.  The cannulated screwdriver was used to remove the screw without difficulty.  The more anterior screw was then identified and removed in the same fashion.  Dissection was then carried posteriorly to the posterior tibial tendon sheath.  The sheath was opened and released proximally and distally.  Adhesions of the tendon distally were released with tenotomy scissors.  The tendon would then move freely within the tendon sheath.  The wound was irrigated copiously and sprinkled with vancomycin powder.  Subcutaneous tissues were approximated with Monocryl.  Skin incisions closed with nylon.  Sterile dressings were applied followed by a compression wrap.  The tourniquet was released after application of the dressings.  The patient was awakened from anesthesia and transported to the recovery room in stable condition.   FOLLOW UP PLAN: Weightbearing as tolerated on the left lower extremity in a cam boot.  Active range of motion.  Follow-up in the office in 2 weeks for suture removal.  No indication for DVT prophylaxis in this ambulatory patient.    Alfredo Martinez PA-C was present and scrubbed for the duration of the operative case.  His assistance was essential in positioning the patient, prepping and draping, gaining and maintaining exposure, performing the operation, closing and dressing the wounds and applying the splint.

## 2022-10-20 NOTE — Transfer of Care (Signed)
Immediate Anesthesia Transfer of Care Note  Patient: Melinda Clark  Procedure(s) Performed: HARDWARE REMOVAL ANKLE (Left: Ankle) POSTERIOR TIBIALTENOLYSIS (Left: Ankle)  Patient Location: PACU  Anesthesia Type:General  Level of Consciousness: awake, alert , and oriented  Airway & Oxygen Therapy: Patient Spontanous Breathing and Patient connected to face mask oxygen  Post-op Assessment: Report given to RN and Post -op Vital signs reviewed and stable  Post vital signs: Reviewed and stable  Last Vitals:  Vitals Value Taken Time  BP    Temp    Pulse 107 10/20/22 0830  Resp 14 10/20/22 0830  SpO2 96 % 10/20/22 0830  Vitals shown include unvalidated device data.  Last Pain:  Vitals:   10/20/22 0635  TempSrc: Oral  PainSc: 3       Patients Stated Pain Goal: 3 (10/20/22 2800)  Complications: No notable events documented.

## 2022-10-21 ENCOUNTER — Encounter (HOSPITAL_BASED_OUTPATIENT_CLINIC_OR_DEPARTMENT_OTHER): Payer: Self-pay | Admitting: Orthopedic Surgery
